# Patient Record
Sex: Male | Born: 1970
Health system: Southern US, Community
[De-identification: ages and names within clinical notes are randomized; demographics above are authoritative.]

## PROBLEM LIST (undated history)

## (undated) DIAGNOSIS — I10 Essential (primary) hypertension: Secondary | ICD-10-CM

## (undated) DIAGNOSIS — N189 Chronic kidney disease, unspecified: Secondary | ICD-10-CM

## (undated) DIAGNOSIS — T7840XA Allergy, unspecified, initial encounter: Secondary | ICD-10-CM

## (undated) HISTORY — DX: Chronic kidney disease, unspecified: N18.9

## (undated) HISTORY — DX: Allergy, unspecified, initial encounter: T78.40XA

## (undated) HISTORY — DX: Essential (primary) hypertension: I10

---

## 2009-09-04 ENCOUNTER — Ambulatory Visit: Payer: Self-pay | Admitting: Family Medicine

## 2009-09-07 LAB — CONVERTED CEMR LAB
ALT: 79 units/L — ABNORMAL HIGH (ref 0–53)
AST: 38 units/L — ABNORMAL HIGH (ref 0–37)
BUN: 21 mg/dL (ref 6–23)
Basophils Absolute: 0 10*3/uL (ref 0.0–0.1)
Bilirubin, Direct: 0.3 mg/dL (ref 0.0–0.3)
Calcium: 9.3 mg/dL (ref 8.4–10.5)
Creatinine, Ser: 1.2 mg/dL (ref 0.4–1.5)
Eosinophils Relative: 3.2 % (ref 0.0–5.0)
GFR calc non Af Amer: 70.86 mL/min (ref >60)
Glucose, Bld: 77 mg/dL (ref 70–99)
HDL: 28.8 mg/dL — ABNORMAL LOW (ref >39.00)
Lymphocytes Relative: 44.8 % (ref 12.0–46.0)
Lymphs Abs: 3.5 10*3/uL (ref 0.7–4.0)
Monocytes Relative: 8.4 % (ref 3.0–12.0)
Neutrophils Relative %: 43.1 % (ref 43.0–77.0)
Platelets: 264 10*3/uL (ref 150.0–400.0)
Potassium: 4.4 meq/L (ref 3.5–5.1)
RDW: 13.3 % (ref 11.5–14.6)
TSH: 1.48 microintl units/mL (ref 0.35–5.50)
Total Bilirubin: 1.5 mg/dL — ABNORMAL HIGH (ref 0.3–1.2)
Triglycerides: 302 mg/dL — ABNORMAL HIGH (ref 0.0–149.0)
VLDL: 60.4 mg/dL — ABNORMAL HIGH (ref 0.0–40.0)
WBC: 7.7 10*3/uL (ref 4.5–10.5)

## 2010-02-09 NOTE — Assessment & Plan Note (Signed)
Summary: BRAND NEW PT/TO EST/PT REQ CPX/PT FASTING/CJR   Vital Signs:  Patient profile:   40 year old male Height:      70.75 inches Weight:      191 pounds BMI:     26.92 Temp:     97.8 degrees F oral Pulse rate:   72 / minute Pulse rhythm:   regular Resp:     12 per minute BP sitting:   120 / 82  (left arm) Cuff size:   regular  Vitals Entered By: Sid Falcon LPN (September 04, 2009 10:36 AM)  Nutrition Counseling: Patient's BMI is greater than 25 and therefore counseled on weight management options. CC: New to [place]   History of Present Illness: New patient to establish care and for wellness visit.  Patient has history of kidney stones 3 times in the past. No other chronic problems. Takes no medications. No prior hospitalizations or surgeries. No known drug allergies.  Family history significant for father with hypertension.  Social history is that he is married and has 2 children ages 61 and 29. Nonsmoker. Rare alcohol use. Works in Scientist, research (physical sciences) & Management  Alcohol-Tobacco     Smoking Status: never  Caffeine-Diet-Exercise     Does Patient Exercise: yes  Allergies (verified): No Known Drug Allergies  Past History:  Family History: Last updated: 09/04/2009 Family History Hypertension, father  Social History: Last updated: 09/04/2009 Occupation:  Finance Married Never Smoked Alcohol use-yes Regular exercise-yes  Risk Factors: Exercise: yes (09/04/2009)  Risk Factors: Smoking Status: never (09/04/2009)  Past Medical History: Chicken pox Kidney stones 2001 PMH-FH-SH reviewed for relevance  Family History: Family History Hypertension, father  Social History: Occupation:  Finance Married Never Smoked Alcohol use-yes Regular exercise-yes Smoking Status:  never Occupation:  employed Does Patient Exercise:  yes  Review of Systems  The patient denies anorexia, fever, weight loss, weight gain, vision loss,  decreased hearing, hoarseness, chest pain, syncope, dyspnea on exertion, peripheral edema, prolonged cough, headaches, hemoptysis, abdominal pain, melena, hematochezia, severe indigestion/heartburn, hematuria, incontinence, genital sores, muscle weakness, suspicious skin lesions, transient blindness, difficulty walking, depression, unusual weight change, abnormal bleeding, enlarged lymph nodes, and testicular masses.    Physical Exam  General:  Well-developed,well-nourished,in no acute distress; alert,appropriate and cooperative throughout examination Head:  Normocephalic and atraumatic without obvious abnormalities. No apparent alopecia or balding. Eyes:  pupils equal, pupils round, and pupils reactive to light.   Ears:  External ear exam shows no significant lesions or deformities.  Otoscopic examination reveals clear canals, tympanic membranes are intact bilaterally without bulging, retraction, inflammation or discharge. Hearing is grossly normal bilaterally. Mouth:  Oral mucosa and oropharynx without lesions or exudates.  Teeth in good repair. Neck:  No deformities, masses, or tenderness noted. Lungs:  Normal respiratory effort, chest expands symmetrically. Lungs are clear to auscultation, no crackles or wheezes. Heart:  Normal rate and regular rhythm. S1 and S2 normal without gallop, murmur, click, rub or other extra sounds. Abdomen:  Bowel sounds positive,abdomen soft and non-tender without masses, organomegaly or hernias noted. Genitalia:  Testes bilaterally descended without nodularity, tenderness or masses. No scrotal masses or lesions. No penis lesions or urethral discharge. Msk:  No deformity or scoliosis noted of thoracic or lumbar spine.   Extremities:  No clubbing, cyanosis, edema, or deformity noted with normal full range of motion of all joints.   Neurologic:  No cranial nerve deficits noted. Station and gait are normal. Plantar reflexes are down-going bilaterally. DTRs are  symmetrical  throughout. Sensory, motor and coordinative functions appear intact. Skin:  Intact without suspicious lesions or rashes Cervical Nodes:  No lymphadenopathy noted Psych:  Oriented X3, good eye contact, not anxious appearing, and not depressed appearing.     Impression & Recommendations:  Problem # 1:  Preventive Health Care (ICD-V70.0) obtain screening labs. Patient needs tetanus booster  Other Orders: Venipuncture (16109) Specimen Handling (60454) Tdap => 63yrs IM (09811) Admin 1st Vaccine (91478) TLB-Lipid Panel (80061-LIPID) TLB-BMP (Basic Metabolic Panel-BMET) (80048-METABOL) TLB-CBC Platelet - w/Differential (85025-CBCD) TLB-Hepatic/Liver Function Pnl (80076-HEPATIC) TLB-TSH (Thyroid Stimulating Hormone) (29562-ZHY)  Patient Instructions: 1)  Please schedule a follow-up appointment in 1 year.  2)  It is important that you exercise reguarly at least 20 minutes 5 times a week. If you develop chest pain, have severe difficulty breathing, or feel very tired, stop exercising immediately and seek medical attention.    Immunizations Administered:  Tetanus Vaccine:    Vaccine Type: Tdap    Site: left deltoid    Mfr: GlaxoSmithKline    Dose: 0.5 ml    Route: IM    Given by: Sid Falcon LPN    Exp. Date: 10/30/2011    Lot #: [id number]    VIS given: 11/28/06 version given September 04, 2009.

## 2010-02-22 ENCOUNTER — Ambulatory Visit: Payer: Self-pay | Admitting: Family Medicine

## 2010-02-26 ENCOUNTER — Ambulatory Visit: Payer: Self-pay | Admitting: Family Medicine

## 2010-03-18 ENCOUNTER — Encounter: Payer: Self-pay | Admitting: Family Medicine

## 2010-03-18 ENCOUNTER — Ambulatory Visit (INDEPENDENT_AMBULATORY_CARE_PROVIDER_SITE_OTHER): Payer: BC Managed Care – PPO | Admitting: Family Medicine

## 2010-03-18 VITALS — BP 120/90 | Temp 98.7°F | Ht 71.0 in | Wt 195.0 lb

## 2010-03-18 DIAGNOSIS — R7402 Elevation of levels of lactic acid dehydrogenase (LDH): Secondary | ICD-10-CM

## 2010-03-18 DIAGNOSIS — R740 Nonspecific elevation of levels of transaminase and lactic acid dehydrogenase [LDH]: Secondary | ICD-10-CM

## 2010-03-18 LAB — HEPATIC FUNCTION PANEL
AST: 39 U/L — ABNORMAL HIGH (ref 0–37)
Albumin: 4.5 g/dL (ref 3.5–5.2)
Alkaline Phosphatase: 96 U/L (ref 39–117)
Bilirubin, Direct: 0.2 mg/dL (ref 0.0–0.3)
Total Protein: 7.3 g/dL (ref 6.0–8.3)

## 2010-03-18 NOTE — Progress Notes (Signed)
  Subjective:    Patient ID: Aaron Mahoney, male    DOB: [birth year], 40 y.o.   MRN: [id number]  HPI  Patient seen for followup from recent physical. Mildly elevated liver transaminases. No prior history of similar problem. Rare alcohol use. No prior history of blood transfusion. Denies any recent abdominal pain, fever, chills, jaundice, pruritus , appetite, or weight changes. No regular medications. One brother apparently has had prior fatty liver changes.   Review of Systems  Constitutional: Negative for fever, chills, activity change, appetite change, fatigue and unexpected weight change.  Gastrointestinal: Negative for nausea, vomiting, abdominal pain, constipation and blood in stool.       Objective:   Physical Exam  Constitutional: He appears well-developed and well-nourished.  Eyes: Conjunctivae are normal. Pupils are equal, round, and reactive to light.  Cardiovascular: Normal rate, regular rhythm and normal heart sounds.   Pulmonary/Chest: Effort normal and breath sounds normal. He has no wheezes. He has no rales.  Abdominal: Soft. Bowel sounds are normal. He exhibits no distension and no mass. There is no tenderness.          Assessment & Plan:   mildly elevated liver transaminases. Suspect related to fatty liver changes.  Work on weight loss. Repeat hepatic panel today

## 2010-03-19 NOTE — Progress Notes (Signed)
Quick Note:  Pt informed ______ 

## 2010-12-13 ENCOUNTER — Encounter: Payer: Self-pay | Admitting: Family Medicine

## 2010-12-13 ENCOUNTER — Ambulatory Visit: Payer: BC Managed Care – PPO | Admitting: Family Medicine

## 2010-12-15 ENCOUNTER — Ambulatory Visit: Payer: BC Managed Care – PPO | Admitting: Family Medicine

## 2011-09-24 ENCOUNTER — Ambulatory Visit: Payer: BC Managed Care – PPO | Admitting: Family Medicine

## 2013-05-22 ENCOUNTER — Emergency Department (HOSPITAL_BASED_OUTPATIENT_CLINIC_OR_DEPARTMENT_OTHER): Payer: BC Managed Care – PPO

## 2013-05-22 ENCOUNTER — Encounter (HOSPITAL_BASED_OUTPATIENT_CLINIC_OR_DEPARTMENT_OTHER): Payer: Self-pay | Admitting: Emergency Medicine

## 2013-05-22 ENCOUNTER — Emergency Department (HOSPITAL_BASED_OUTPATIENT_CLINIC_OR_DEPARTMENT_OTHER)
Admission: EM | Admit: 2013-05-22 | Discharge: 2013-05-22 | Disposition: A | Discharge status: Home / Self Care | Payer: BC Managed Care – PPO | Attending: Emergency Medicine | Admitting: Emergency Medicine

## 2013-05-22 DIAGNOSIS — Z87440 Personal history of urinary (tract) infections: Secondary | ICD-10-CM | POA: Insufficient documentation

## 2013-05-22 DIAGNOSIS — R110 Nausea: Secondary | ICD-10-CM | POA: Insufficient documentation

## 2013-05-22 DIAGNOSIS — R319 Hematuria, unspecified: Secondary | ICD-10-CM | POA: Insufficient documentation

## 2013-05-22 DIAGNOSIS — N23 Unspecified renal colic: Secondary | ICD-10-CM | POA: Insufficient documentation

## 2013-05-22 DIAGNOSIS — M549 Dorsalgia, unspecified: Secondary | ICD-10-CM | POA: Insufficient documentation

## 2013-05-22 LAB — URINALYSIS, ROUTINE W REFLEX MICROSCOPIC
Bilirubin Urine: NEGATIVE
GLUCOSE, UA: NEGATIVE mg/dL
KETONES UR: 40 mg/dL — AB
Leukocytes, UA: NEGATIVE
Nitrite: NEGATIVE
PROTEIN: NEGATIVE mg/dL
Specific Gravity, Urine: 1.023 (ref 1.005–1.030)
UROBILINOGEN UA: 0.2 mg/dL (ref 0.0–1.0)
pH: 5 (ref 5.0–8.0)

## 2013-05-22 LAB — CBC WITH DIFFERENTIAL/PLATELET
Basophils Absolute: 0 10*3/uL (ref 0.0–0.1)
Basophils Relative: 0 % (ref 0–1)
EOS PCT: 1 % (ref 0–5)
Eosinophils Absolute: 0.1 10*3/uL (ref 0.0–0.7)
HCT: 41.2 % (ref 39.0–52.0)
Hemoglobin: 14.5 g/dL (ref 13.0–17.0)
LYMPHS ABS: 1.7 10*3/uL (ref 0.7–4.0)
LYMPHS PCT: 14 % (ref 12–46)
MCH: 30.7 pg (ref 26.0–34.0)
MCHC: 35.2 g/dL (ref 30.0–36.0)
MCV: 87.3 fL (ref 78.0–100.0)
Monocytes Absolute: 1.3 10*3/uL — ABNORMAL HIGH (ref 0.1–1.0)
Monocytes Relative: 10 % (ref 3–12)
NEUTROS ABS: 9.5 10*3/uL — AB (ref 1.7–7.7)
Neutrophils Relative %: 75 % (ref 43–77)
PLATELETS: 240 10*3/uL (ref 150–400)
RBC: 4.72 MIL/uL (ref 4.22–5.81)
RDW: 12.9 % (ref 11.5–15.5)
WBC: 12.7 10*3/uL — AB (ref 4.0–10.5)

## 2013-05-22 LAB — URINE MICROSCOPIC-ADD ON

## 2013-05-22 LAB — COMPREHENSIVE METABOLIC PANEL
ALK PHOS: 65 U/L (ref 39–117)
ALT: 23 U/L (ref 0–53)
AST: 24 U/L (ref 0–37)
Albumin: 4.2 g/dL (ref 3.5–5.2)
BILIRUBIN TOTAL: 1.6 mg/dL — AB (ref 0.3–1.2)
BUN: 17 mg/dL (ref 6–23)
CHLORIDE: 103 meq/L (ref 96–112)
CO2: 24 meq/L (ref 19–32)
Calcium: 10 mg/dL (ref 8.4–10.5)
Creatinine, Ser: 1.8 mg/dL — ABNORMAL HIGH (ref 0.50–1.35)
GFR calc Af Amer: 51 mL/min — ABNORMAL LOW (ref >90)
GFR, EST NON AFRICAN AMERICAN: 44 mL/min — AB (ref >90)
Glucose, Bld: 106 mg/dL — ABNORMAL HIGH (ref 70–99)
POTASSIUM: 3.7 meq/L (ref 3.7–5.3)
Sodium: 142 mEq/L (ref 137–147)
Total Protein: 7 g/dL (ref 6.0–8.3)

## 2013-05-22 MED ORDER — OXYCODONE-ACETAMINOPHEN 5-325 MG PO TABS: 2.0000 | ORAL_TABLET | ORAL | Status: DC | PRN

## 2013-05-22 MED ORDER — KETOROLAC TROMETHAMINE 30 MG/ML IJ SOLN
30.0000 mg | Freq: Once | INTRAMUSCULAR | Status: AC
  Administered 2013-05-22: 30 mg via INTRAVENOUS
  Filled 2013-05-22: qty 1

## 2013-05-22 MED ORDER — HYDROMORPHONE HCL PF 1 MG/ML IJ SOLN
1.0000 mg | Freq: Once | INTRAMUSCULAR | Status: AC
  Administered 2013-05-22: 1 mg via INTRAVENOUS

## 2013-05-22 MED ORDER — ONDANSETRON HCL 4 MG/2ML IJ SOLN
4.0000 mg | Freq: Once | INTRAMUSCULAR | Status: AC
  Administered 2013-05-22: 4 mg via INTRAVENOUS
  Filled 2013-05-22: qty 2

## 2013-05-22 MED ORDER — ONDANSETRON 4 MG PO TBDP: ORAL_TABLET | ORAL | Status: DC

## 2013-05-22 MED ORDER — MORPHINE SULFATE 4 MG/ML IJ SOLN
4.0000 mg | Freq: Once | INTRAMUSCULAR | Status: AC
  Administered 2013-05-22: 4 mg via INTRAVENOUS
  Filled 2013-05-22: qty 1

## 2013-05-22 MED ORDER — SODIUM CHLORIDE 0.9 % IV BOLUS (SEPSIS)
1000.0000 mL | Freq: Once | INTRAVENOUS | Status: AC
  Administered 2013-05-22: 1000 mL via INTRAVENOUS

## 2013-05-22 MED ORDER — TAMSULOSIN HCL 0.4 MG PO CAPS: 0.4000 mg | ORAL_CAPSULE | Freq: Every day | ORAL | Status: DC

## 2013-05-22 MED ORDER — KETOROLAC TROMETHAMINE 10 MG PO TABS: 10.0000 mg | ORAL_TABLET | Freq: Four times a day (QID) | ORAL | Status: DC | PRN

## 2013-05-22 MED ORDER — HYDROMORPHONE HCL PF 1 MG/ML IJ SOLN
INTRAMUSCULAR | Status: AC
  Filled 2013-05-22: qty 1

## 2013-05-22 NOTE — Discharge Instructions (Signed)
Kidney Stones  Kidney stones (urolithiasis) are deposits that form inside your kidneys. The intense pain is caused by the stone moving through the urinary tract. When the stone moves, the ureter goes into spasm around the stone. The stone is usually passed in the urine.   CAUSES   · A disorder that makes certain neck glands produce too much parathyroid hormone (primary hyperparathyroidism).  · A buildup of uric acid crystals, similar to gout in your joints.  · Narrowing (stricture) of the ureter.  · A kidney obstruction present at birth (congenital obstruction).  · Previous surgery on the kidney or ureters.  · Numerous kidney infections.  SYMPTOMS   · Feeling sick to your stomach (nauseous).  · Throwing up (vomiting).  · Blood in the urine (hematuria).  · Pain that usually spreads (radiates) to the groin.  · Frequency or urgency of urination.  DIAGNOSIS   · Taking a history and physical exam.  · Blood or urine tests.  · CT scan.  · Occasionally, an examination of the inside of the urinary bladder (cystoscopy) is performed.  TREATMENT   · Observation.  · Increasing your fluid intake.  · Extracorporeal shock wave lithotripsy This is a noninvasive procedure that uses shock waves to break up kidney stones.  · Surgery may be needed if you have severe pain or persistent obstruction. There are various surgical procedures. Most of the procedures are performed with the use of small instruments. Only small incisions are needed to accommodate these instruments, so recovery time is minimized.  The size, location, and chemical composition are all important variables that will determine the proper choice of action for you. Talk to your health care provider to better understand your situation so that you will minimize the risk of injury to yourself and your kidney.   HOME CARE INSTRUCTIONS   · Drink enough water and fluids to keep your urine clear or pale yellow. This will help you to pass the stone or stone fragments.  · Strain  all urine through the provided strainer. Keep all particulate matter and stones for your health care provider to see. The stone causing the pain may be as small as a grain of salt. It is very important to use the strainer each and every time you pass your urine. The collection of your stone will allow your health care provider to analyze it and verify that a stone has actually passed. The stone analysis will often identify what you can do to reduce the incidence of recurrences.  · Only take over-the-counter or prescription medicines for pain, discomfort, or fever as directed by your health care provider.  · Make a follow-up appointment with your health care provider as directed.  · Get follow-up X-rays if required. The absence of pain does not always mean that the stone has passed. It may have only stopped moving. If the urine remains completely obstructed, it can cause loss of kidney function or even complete destruction of the kidney. It is your responsibility to make sure X-rays and follow-ups are completed. Ultrasounds of the kidney can show blockages and the status of the kidney. Ultrasounds are not associated with any radiation and can be performed easily in a matter of minutes.  SEEK MEDICAL CARE IF:  · You experience pain that is progressive and unresponsive to any pain medicine you have been prescribed.  SEEK IMMEDIATE MEDICAL CARE IF:   · Pain cannot be controlled with the prescribed medicine.  · You have a fever   or shaking chills.  · The severity or intensity of pain increases over 18 hours and is not relieved by pain medicine.  · You develop a new onset of abdominal pain.  · You feel faint or pass out.  · You are unable to urinate.  MAKE SURE YOU:   · Understand these instructions.  · Will watch your condition.  · Will get help right away if you are not doing well or get worse.  Document Released: 12/27/2004 Document Revised: 08/29/2012 Document Reviewed: 05/30/2012  [employer]® Patient Information ©2014  [employer].

## 2013-05-22 NOTE — ED Notes (Signed)
Left flank pain intermittently since Monday, no relief from OTC ibuprofen, nausea and vomiting

## 2013-05-22 NOTE — ED Provider Notes (Signed)
CSN: [id number]     Arrival date & time 05/22/13  0245 History   First MD Initiated Contact with Patient 05/22/13 0248     Chief Complaint  Patient presents with  . Flank Pain     (Consider location/radiation/quality/duration/timing/severity/associated sxs/prior Treatment) HPI Patient was diagnosed with renal stones 15 years ago and has had intermittent passage of stones since. He states that starting Mondays had intermittent left flank pain that radiates to his groin. It is associated with nausea multiple episodes of vomiting. He's been taking ibuprofen over-the-counter without relief. He's had no fevers or chills. He has noticed blood in his urine. His last CT scan was roughly 15 years ago. He has yet to see a urologist. Past Medical History  Diagnosis Date  . Chronic kidney disease     stone history   History reviewed. No pertinent past surgical history. Family History  Problem Relation Age of Onset  . Hypertension Father   . Heart disease Father   . Hypertension Brother    History  Substance Use Topics  . Smoking status: Never Smoker   . Smokeless tobacco: Not on file  . Alcohol Use: No    Review of Systems  Constitutional: Negative for fever and chills.  Respiratory: Negative for shortness of breath.   Cardiovascular: Negative for chest pain.  Gastrointestinal: Positive for nausea. Negative for abdominal pain.  Genitourinary: Positive for hematuria and flank pain.  Musculoskeletal: Positive for back pain. Negative for neck pain and neck stiffness.  Skin: Negative for rash and wound.  Neurological: Negative for dizziness, weakness, light-headedness and headaches.  All other systems reviewed and are negative.     Allergies  Review of patient's allergies indicates no known allergies.  Home Medications   Prior to Admission medications   Not on File   BP 148/93  Pulse 78  Temp(Src) 98 F (36.7 C) (Oral)  Resp 16  Ht 6\' 2"  (1.88 m)  Wt 160 lb (72.576 kg)   BMI 20.53 kg/m2  SpO2 97% Physical Exam  Nursing note and vitals reviewed. Constitutional: He is oriented to person, place, and time. He appears well-developed and well-nourished. No distress.  HENT:  Head: Normocephalic and atraumatic.  Mouth/Throat: Oropharynx is clear and moist.  Eyes: EOM are normal. Pupils are equal, round, and reactive to light.  Neck: Normal range of motion. Neck supple.  Cardiovascular: Normal rate and regular rhythm.   Pulmonary/Chest: Effort normal and breath sounds normal. No respiratory distress. He has no wheezes. He has no rales.  Abdominal: Soft. Bowel sounds are normal. He exhibits no distension and no mass. There is no tenderness. There is no rebound and no guarding.  Musculoskeletal: Normal range of motion. He exhibits no edema and no tenderness.  No CVA tenderness  Neurological: He is alert and oriented to person, place, and time.  Skin: Skin is warm and dry. No rash noted. No erythema.  Psychiatric: He has a normal mood and affect. His behavior is normal.    ED Course  Procedures (including critical care time) Labs Review Labs Reviewed  CBC WITH DIFFERENTIAL - Abnormal; Notable for the following:    WBC 12.7 (*)    Neutro Abs 9.5 (*)    Monocytes Absolute 1.3 (*)    All other components within normal limits  COMPREHENSIVE METABOLIC PANEL - Abnormal; Notable for the following:    Glucose, Bld 106 (*)    Creatinine, Ser 1.80 (*)    Total Bilirubin 1.6 (*)    GFR  calc non Af Amer 44 (*)    GFR calc Af Amer 51 (*)    All other components within normal limits  URINALYSIS, ROUTINE W REFLEX MICROSCOPIC - Abnormal; Notable for the following:    Hgb urine dipstick MODERATE (*)    Ketones, ur 40 (*)    All other components within normal limits  URINE MICROSCOPIC-ADD ON - Abnormal; Notable for the following:    Bacteria, UA FEW (*)    Casts WBC CAST (*)    All other components within normal limits    Imaging Review Ct Abdomen Pelvis Wo  Contrast  05/22/2013   CLINICAL DATA:  Left flank pain.  EXAM: CT ABDOMEN AND PELVIS WITHOUT CONTRAST  TECHNIQUE: Multidetector CT imaging of the abdomen and pelvis was performed following the standard protocol without IV contrast.  COMPARISON:  None.  FINDINGS: Included view of the lung bases are clear. The visualized heart and pericardium are unremarkable.  Kidneys are orthotopic, demonstrating normal size and morphology. Mild left hydroureteronephrosis to the level of the ureterovesicular junction where a 4 mm calculus is seen. Mild left perinephric stranding. 3 mm nonobstructing right interpolar calculus. Nephrolithiasis, hydronephrosis; limited assessment for renal masses on this nonenhanced examination. The unopacified ureters are normal in course and caliber. Urinary bladder is partially distended and unremarkable.  The liver, spleen, gallbladder, pancreas and adrenal glands are unremarkable for this non-contrast examination.  The stomach, small and large bowel are normal in course and caliber without inflammatory changes, the sensitivity may be decreased by lack of enteric contrast. Mild sigmoid diverticulosis. Normal appendix. No intraperitoneal free fluid nor free air.  Aortoiliac vessels are normal in course and caliber. No lymphadenopathy by CT size criteria. Internal reproductive organs are unremarkable. The soft tissues and included osseous structures are nonsuspicious. Dextroscoliosis. Mild degenerative change of the hips. Moderate to severe L4-5 degenerative disc disease resulting in moderate right L4-5 neural foraminal narrowing.  IMPRESSION: Mild left hydroureteronephrosis to the level of the ureterovesicular junction where a 4 mm calculus is seen. Mild nonspecific left perinephric stranding could reflect urinary tract infection.  Nonobstructing 3 mm right interpolar calculus.   Electronically Signed   By: Awilda Metroourtnay  Bloomer   On: 05/22/2013 03:40     EKG Interpretation None      MDM    Final diagnoses:  None    Patient's pain has resolved. He is not vomiting in the emergency department. He is afebrile. Low suspicion for infected stone. Advised followup with a urologist. Maryclare LabradorWe'll discharge home with pain medication and antiemetics. Return precautions given.    Loren Raceravid Ricky Gallery, MD 05/22/13 [phone]

## 2013-05-22 NOTE — ED Notes (Signed)
MD at bedside. 

## 2015-08-20 ENCOUNTER — Emergency Department (HOSPITAL_BASED_OUTPATIENT_CLINIC_OR_DEPARTMENT_OTHER): Payer: Managed Care, Other (non HMO)

## 2015-08-20 ENCOUNTER — Emergency Department (HOSPITAL_BASED_OUTPATIENT_CLINIC_OR_DEPARTMENT_OTHER)
Admission: EM | Admit: 2015-08-20 | Discharge: 2015-08-20 | Disposition: A | Discharge status: Home / Self Care | Payer: Managed Care, Other (non HMO) | Attending: Emergency Medicine | Admitting: Emergency Medicine

## 2015-08-20 ENCOUNTER — Encounter (HOSPITAL_BASED_OUTPATIENT_CLINIC_OR_DEPARTMENT_OTHER): Payer: Self-pay | Admitting: *Deleted

## 2015-08-20 DIAGNOSIS — Z791 Long term (current) use of non-steroidal anti-inflammatories (NSAID): Secondary | ICD-10-CM | POA: Diagnosis not present

## 2015-08-20 DIAGNOSIS — N189 Chronic kidney disease, unspecified: Secondary | ICD-10-CM | POA: Diagnosis not present

## 2015-08-20 DIAGNOSIS — N23 Unspecified renal colic: Secondary | ICD-10-CM | POA: Diagnosis not present

## 2015-08-20 DIAGNOSIS — R319 Hematuria, unspecified: Secondary | ICD-10-CM | POA: Diagnosis present

## 2015-08-20 LAB — URINALYSIS, ROUTINE W REFLEX MICROSCOPIC
Bilirubin Urine: NEGATIVE
Glucose, UA: NEGATIVE mg/dL
Ketones, ur: 15 mg/dL — AB
NITRITE: NEGATIVE
PROTEIN: 30 mg/dL — AB
Specific Gravity, Urine: 1.015 (ref 1.005–1.030)
pH: 7 (ref 5.0–8.0)

## 2015-08-20 LAB — URINE MICROSCOPIC-ADD ON

## 2015-08-20 MED ORDER — OXYCODONE-ACETAMINOPHEN 5-325 MG PO TABS: 1.0000 | ORAL_TABLET | ORAL | 0 refills | Status: DC | PRN

## 2015-08-20 MED ORDER — TAMSULOSIN HCL 0.4 MG PO CAPS: 0.4000 mg | ORAL_CAPSULE | Freq: Every day | ORAL | 0 refills | Status: DC

## 2015-08-20 MED ORDER — HYDROMORPHONE HCL 1 MG/ML IJ SOLN
1.0000 mg | Freq: Once | INTRAMUSCULAR | Status: AC
  Administered 2015-08-20: 1 mg via INTRAVENOUS

## 2015-08-20 MED ORDER — KETOROLAC TROMETHAMINE 30 MG/ML IJ SOLN
30.0000 mg | Freq: Once | INTRAMUSCULAR | Status: AC
  Administered 2015-08-20: 30 mg via INTRAVENOUS
  Filled 2015-08-20: qty 1

## 2015-08-20 MED ORDER — HYDROMORPHONE HCL 1 MG/ML IJ SOLN
INTRAMUSCULAR | Status: AC
  Administered 2015-08-20: 1 mg via INTRAVENOUS
  Filled 2015-08-20: qty 1

## 2015-08-20 MED ORDER — ONDANSETRON HCL 4 MG/2ML IJ SOLN
INTRAMUSCULAR | Status: AC
  Administered 2015-08-20: 4 mg via INTRAVENOUS
  Filled 2015-08-20: qty 2

## 2015-08-20 MED ORDER — ONDANSETRON HCL 4 MG PO TABS: 4.0000 mg | ORAL_TABLET | Freq: Three times a day (TID) | ORAL | 0 refills | Status: DC | PRN

## 2015-08-20 MED ORDER — ONDANSETRON HCL 4 MG/2ML IJ SOLN
4.0000 mg | Freq: Once | INTRAMUSCULAR | Status: AC
  Administered 2015-08-20: 4 mg via INTRAVENOUS

## 2015-08-20 MED FILL — TAMSULOSIN HCL 0.4 MG CAP: 0.4 | 30 days supply | Qty: 30 | Fill #0

## 2015-08-20 MED FILL — ONDANSETRON HCL 4 MG TABLET: 4 | 4 days supply | Qty: 12 | Fill #0

## 2015-08-20 MED FILL — OXYCODONE/APAP 5-325: 5-325 | 2 days supply | Qty: 20 | Fill #0

## 2015-08-20 NOTE — ED Triage Notes (Signed)
Patient has a history of kidney stones.  States three days ago, he developed intermittent left lower back pain with radiation into the flank area.  States this morning, he developed a constant pain in the left groin area and is urinating blood.  Describes pain as sharp, stabbing and constant.

## 2015-08-20 NOTE — ED Provider Notes (Signed)
[facility] Provider Note   CSN: [id number] Arrival date & time: 08/20/15  0909  First Provider Contact:  First MD Initiated Contact with Patient 08/20/15 [phone]        History   Chief Complaint Chief Complaint  Patient presents with  . Flank Pain    HPI Aaron Mahoney is a 45 y.o. male.  HPI   Patient with hx kidney stones presents with three days left flank pain now causing sharp pain in the left groin area.  Pain is sharp and was initially intermittent, now constant.  Hematuria this morning.  Feels like prior kidney stones.  Denies fevers, chills, testicular swelling, nausea/vomiting.  Has a urologist at [facility] in [place], unsure of name.  Has never had to have a procedure to pass a kidney stone, usually occur every 2 years.    Past Medical History:  Diagnosis Date  . Chronic kidney disease    stone history    There are no active problems to display for this patient.   History reviewed. No pertinent surgical history.     Home Medications    Prior to Admission medications   Medication Sig Start Date End Date Taking? Authorizing Provider  ibuprofen (ADVIL,MOTRIN) 800 MG tablet Take 800 mg by mouth every 8 (eight) hours as needed.   Yes Historical Provider, MD  ketorolac (TORADOL) 10 MG tablet Take 1 tablet (10 mg total) by mouth every 6 (six) hours as needed. 05/22/13   Loren Racer, MD  ondansetron (ZOFRAN ODT) 4 MG disintegrating tablet  ODT q4 hours prn nausea/vomit 05/22/13   Loren Racer, MD  ondansetron (ZOFRAN) 4 MG tablet Take 1 tablet (4 mg total) by mouth every 8 (eight) hours as needed for nausea or vomiting. 08/20/15   Trixie Dredge, PA-C  oxyCODONE-acetaminophen (PERCOCET/ROXICET) 5-325 MG tablet Take 1-2 tablets by mouth every 4 (four) hours as needed for moderate pain or severe pain. 08/20/15   Trixie Dredge, PA-C  tamsulosin (FLOMAX) 0.4 MG CAPS capsule Take 1 capsule (0.4 mg total) by mouth daily. 08/20/15   Trixie Dredge, PA-C     Family History Family History  Problem Relation Age of Onset  . Hypertension Father   . Heart disease Father   . Hypertension Brother     Social History Social History  Substance Use Topics  . Smoking status: Never Smoker  . Smokeless tobacco: Not on file  . Alcohol use No     Allergies   Review of patient's allergies indicates no known allergies.   Review of Systems Review of Systems  Constitutional: Negative for chills and fever.  Gastrointestinal: Negative for nausea and vomiting.  Genitourinary: Positive for flank pain and hematuria. Negative for penile swelling and scrotal swelling.  Musculoskeletal: Positive for back pain.  Allergic/Immunologic: Negative for immunocompromised state.  Hematological: Does not bruise/bleed easily.  Psychiatric/Behavioral: Negative for self-injury.     Physical Exam Updated Vital Signs BP 138/100 (BP Location: Left Arm)   Pulse (!) 51   Temp 97.7 F (36.5 C) (Oral)   Resp 16   Ht 6' (1.829 m)   Wt 81.6 kg   SpO2 99%   BMI 24.41 kg/m   Physical Exam  Constitutional: He appears well-developed and well-nourished. No distress.  HENT:  Head: Normocephalic and atraumatic.  Neck: Neck supple.  Cardiovascular: Normal rate and regular rhythm.   Pulmonary/Chest: Effort normal and breath sounds normal. No respiratory distress. He has no wheezes. He has no rales.  Abdominal: Soft. He exhibits  no distension and no mass. There is no tenderness. There is no rebound and no guarding.  No CVA tenderness   Neurological: He is alert. He exhibits normal muscle tone.  Skin: He is not diaphoretic.  Nursing note and vitals reviewed.    ED Treatments / Results  Labs (all labs ordered are listed, but only abnormal results are displayed) Labs Reviewed  URINALYSIS, ROUTINE W REFLEX MICROSCOPIC (NOT AT [facility]) - Abnormal; Notable for the following:       Result Value   Color, Urine RED (*)    APPearance CLOUDY (*)    Hgb urine dipstick  LARGE (*)    Ketones, ur 15 (*)    Protein, ur 30 (*)    Leukocytes, UA TRACE (*)    All other components within normal limits  URINE MICROSCOPIC-ADD ON - Abnormal; Notable for the following:    Squamous Epithelial / LPF 0-5 (*)    Bacteria, UA RARE (*)    All other components within normal limits  URINE CULTURE    EKG  EKG Interpretation None       Radiology US Renal  Result Date: 08/20/2015 CLINICAL DATA:  45 year old male with severe left flank pain radiating into the left groin for the past 3 days EXAM: RENAL / URINARY TRACT ULTRASOUND COMPLETE COMPARISON:  Prior CT abdomen/ pelvis 05/22/2013 FINDINGS: Right Kidney: Length: 9.7 cm. Echogenicity within normal limits. No mass or hydronephrosis visualized. Left Kidney: Length: 10.2 cm. Echogenicity within normal limits. No mass or hydronephrosis visualized. Bladder: Appears normal for degree of bladder distention. Both ureteral jets are visualized. Other: Incidental imaging of the liver demonstrates heterogeneous and increased echogenicity with coarsening of the echotexture. The adjacent kidney appears hypoechoic in comparison. These findings are consistent with hepatic steatosis. IMPRESSION: 1. No hydronephrosis. 2. No definite renal calculi imaged sonographically. 3. Hepatic steatosis. Electronically Signed   By: Malachy Moan M.D.   On: 08/20/2015 10:27    Procedures Procedures (including critical care time)  Medications Ordered in ED Medications  ondansetron (ZOFRAN) injection 4 mg (4 mg Intravenous Given 08/20/15 0945)  HYDROmorphone (DILAUDID) injection 1 mg (1 mg Intravenous Given 08/20/15 0945)  ketorolac (TORADOL) 30 MG/ML injection 30 mg (30 mg Intravenous Given 08/20/15 1016)     Initial Impression / Assessment and Plan / ED Course  I have reviewed the triage vital signs and the nursing notes.  Pertinent labs & imaging results that were available during my care of the patient were reviewed by me and considered in  my medical decision making (see chart for details).  Clinical Course    Afebrile, nontoxic patient with left flank pain and hematuria.  US unremarkable.  UA does not appear infected.  Clinically c/w ureteral stone.  D/C home with percocet, zofran, flomax, urology follow up.  Discussed result, findings, treatment, and follow up  with patient.  Pt given return precautions.  Pt verbalizes understanding and agrees with plan.       Final Clinical Impressions(s) / ED Diagnoses   Final diagnoses:  Ureteral colic    New Prescriptions New Prescriptions   ONDANSETRON (ZOFRAN) 4 MG TABLET    Take 1 tablet (4 mg total) by mouth every 8 (eight) hours as needed for nausea or vomiting.   OXYCODONE-ACETAMINOPHEN (PERCOCET/ROXICET) 5-325 MG TABLET    Take 1-2 tablets by mouth every 4 (four) hours as needed for moderate pain or severe pain.   TAMSULOSIN (FLOMAX) 0.4 MG CAPS CAPSULE    Take 1 capsule (0.4  mg total) by mouth daily.     Trixie Dredgemily Gemini Bunte, PA-C 08/20/15 1147    Vanetta MuldersScott Zackowski, MD 08/22/15 2145

## 2015-08-20 NOTE — Discharge Instructions (Signed)
Read the information below.  Use the prescribed medication as directed.  Please discuss all new medications with your pharmacist.  Do not take additional tylenol while taking the prescribed pain medication to avoid overdose.  You may return to the Emergency Department at any time for worsening condition or any new symptoms that concern you.    If you develop high fevers, worsening abdominal pain, uncontrolled vomiting, or are unable to tolerate fluids by mouth, return to the ER for a recheck.   °

## 2015-08-21 LAB — URINE CULTURE: Culture: NO GROWTH

## 2016-02-29 ENCOUNTER — Ambulatory Visit: Payer: Managed Care, Other (non HMO) | Admitting: Family Medicine

## 2016-03-01 ENCOUNTER — Ambulatory Visit: Payer: Managed Care, Other (non HMO) | Admitting: Adult Health

## 2016-05-24 ENCOUNTER — Encounter: Payer: Self-pay | Admitting: Family Medicine

## 2016-05-24 ENCOUNTER — Ambulatory Visit (INDEPENDENT_AMBULATORY_CARE_PROVIDER_SITE_OTHER): Payer: Managed Care, Other (non HMO) | Admitting: Family Medicine

## 2016-05-24 VITALS — BP 130/86 | HR 60 | Temp 98.4°F | Ht 70.0 in | Wt 190.0 lb

## 2016-05-24 DIAGNOSIS — N200 Calculus of kidney: Secondary | ICD-10-CM | POA: Diagnosis not present

## 2016-05-24 NOTE — Progress Notes (Signed)
   Subjective:    Patient ID: Aaron Mahoney, male    DOB: 10/06/1970, 46 y.o.   MRN: [id number]  HPI 46 yr old male to re-establish with us for primary care. He has been quite healthy except for a hx of kidney stones. He has passed 4 or 5 of these over the years, the last one was 2 years ago. He feels well and has no concerns today. 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 He is married with 4 children ages 2 to 3914.    Review of Systems  Constitutional: Negative.   Respiratory: Negative.   Cardiovascular: Negative.   Gastrointestinal: Negative.   Genitourinary: Negative.   Neurological: Negative.        Objective:   Physical Exam  Constitutional: He is oriented to person, place, and time. He appears well-developed and well-nourished.  Neck: No thyromegaly present.  Cardiovascular: Normal rate, regular rhythm, normal heart sounds and intact distal pulses.   Pulmonary/Chest: Effort normal and breath sounds normal. No respiratory distress. He has no wheezes. He has no rales.  Lymphadenopathy:    He has no cervical adenopathy.  Neurological: He is alert and oriented to person, place, and time.          Assessment & Plan:  Introductory visit for this patient who has a hx of renal stones. He drinks plenty of water every day. He plans to return soon for fasting labs and a complete physical exam.  Gershon CraneStephen Fry, MD

## 2016-05-24 NOTE — Patient Instructions (Signed)
WE NOW OFFER   [facility] FAST TRACK!!!  SAME DAY Appointments for ACUTE CARE  Such as: Sprains, Injuries, cuts, abrasions, rashes, muscle pain, joint pain, back pain Colds, flu, sore throats, headache, allergies, cough, fever  Ear pain, sinus and eye infections Abdominal pain, nausea, vomiting, diarrhea, upset stomach Animal/insect bites  3 Easy Ways to Schedule: Walk-In Scheduling Call in scheduling Mychart Sign-up: [email or web address]         

## 2016-07-28 ENCOUNTER — Encounter: Payer: Self-pay | Admitting: Family Medicine

## 2016-07-28 ENCOUNTER — Ambulatory Visit (INDEPENDENT_AMBULATORY_CARE_PROVIDER_SITE_OTHER): Payer: Managed Care, Other (non HMO) | Admitting: Family Medicine

## 2016-07-28 VITALS — BP 143/93 | HR 50 | Temp 98.3°F | Ht 70.0 in | Wt 186.0 lb

## 2016-07-28 DIAGNOSIS — Z0000 Encounter for general adult medical examination without abnormal findings: Secondary | ICD-10-CM | POA: Diagnosis not present

## 2016-07-28 LAB — HEPATIC FUNCTION PANEL
ALBUMIN: 4.4 g/dL (ref 3.5–5.2)
ALK PHOS: 87 U/L (ref 39–117)
ALT: 34 U/L (ref 0–53)
AST: 23 U/L (ref 0–37)
Bilirubin, Direct: 0.2 mg/dL (ref 0.0–0.3)
Total Bilirubin: 1.3 mg/dL — ABNORMAL HIGH (ref 0.2–1.2)
Total Protein: 6.6 g/dL (ref 6.0–8.3)

## 2016-07-28 LAB — POC URINALSYSI DIPSTICK (AUTOMATED)
Bilirubin, UA: NEGATIVE
GLUCOSE UA: NEGATIVE
Ketones, UA: NEGATIVE
Leukocytes, UA: NEGATIVE
NITRITE UA: NEGATIVE
PH UA: 6 (ref 5.0–8.0)
Protein, UA: NEGATIVE
Spec Grav, UA: 1.015 (ref 1.010–1.025)
UROBILINOGEN UA: 0.2 U/dL

## 2016-07-28 LAB — BASIC METABOLIC PANEL
BUN: 15 mg/dL (ref 6–23)
CO2: 31 mEq/L (ref 19–32)
CREATININE: 1.11 mg/dL (ref 0.40–1.50)
Calcium: 9.7 mg/dL (ref 8.4–10.5)
Chloride: 102 mEq/L (ref 96–112)
GFR: 75.11 mL/min (ref >60.00)
Glucose, Bld: 89 mg/dL (ref 70–99)
Potassium: 3.9 mEq/L (ref 3.5–5.1)
SODIUM: 140 meq/L (ref 135–145)

## 2016-07-28 LAB — CBC WITH DIFFERENTIAL/PLATELET
Basophils Absolute: 0.1 10*3/uL (ref 0.0–0.1)
Basophils Relative: 0.8 % (ref 0.0–3.0)
EOS ABS: 0.1 10*3/uL (ref 0.0–0.7)
Eosinophils Relative: 0.9 % (ref 0.0–5.0)
HCT: 45.2 % (ref 39.0–52.0)
HEMOGLOBIN: 15.4 g/dL (ref 13.0–17.0)
Lymphocytes Relative: 29.2 % (ref 12.0–46.0)
Lymphs Abs: 1.9 10*3/uL (ref 0.7–4.0)
MCHC: 34.1 g/dL (ref 30.0–36.0)
MCV: 85.6 fl (ref 78.0–100.0)
MONO ABS: 0.6 10*3/uL (ref 0.1–1.0)
Monocytes Relative: 9.1 % (ref 3.0–12.0)
Neutro Abs: 3.9 10*3/uL (ref 1.4–7.7)
Neutrophils Relative %: 60 % (ref 43.0–77.0)
Platelets: 268 10*3/uL (ref 150.0–400.0)
RBC: 5.28 Mil/uL (ref 4.22–5.81)
RDW: 13.2 % (ref 11.5–15.5)
WBC: 6.6 10*3/uL (ref 4.0–10.5)

## 2016-07-28 LAB — LIPID PANEL
Cholesterol: 190 mg/dL (ref 0–200)
HDL: 43 mg/dL (ref >39.00)
LDL Cholesterol: 108 mg/dL — ABNORMAL HIGH (ref 0–99)
NonHDL: 146.79
Total CHOL/HDL Ratio: 4
Triglycerides: 193 mg/dL — ABNORMAL HIGH (ref 0.0–149.0)
VLDL: 38.6 mg/dL (ref 0.0–40.0)

## 2016-07-28 LAB — TSH: TSH: 2.58 u[IU]/mL (ref 0.35–4.50)

## 2016-07-28 NOTE — Progress Notes (Signed)
   Subjective:    Patient ID: Aaron Mahoney, male    DOB: 10/06/1970, 46 y.o.   MRN: [id number]  HPI Here for a well exam. He feels fine. We have a borderline high BP today, but he says at home he normally runs in the 130s over 80s.    Review of Systems  Constitutional: Negative.   HENT: Negative.   Eyes: Negative.   Respiratory: Negative.   Cardiovascular: Negative.   Gastrointestinal: Negative.   Genitourinary: Negative.   Musculoskeletal: Negative.   Skin: Negative.   Neurological: Negative.   Psychiatric/Behavioral: Negative.        Objective:   Physical Exam  Constitutional: He is oriented to person, place, and time. He appears well-developed and well-nourished. No distress.  HENT:  Head: Normocephalic and atraumatic.  Right Ear: External ear normal.  Left Ear: External ear normal.  Nose: Nose normal.  Mouth/Throat: Oropharynx is clear and moist. No oropharyngeal exudate.  Eyes: Pupils are equal, round, and reactive to light. Conjunctivae and EOM are normal. Right eye exhibits no discharge. Left eye exhibits no discharge. No scleral icterus.  Neck: Neck supple. No JVD present. No tracheal deviation present. No thyromegaly present.  Cardiovascular: Normal rate, regular rhythm, normal heart sounds and intact distal pulses.  Exam reveals no gallop and no friction rub.   No murmur heard. Pulmonary/Chest: Effort normal and breath sounds normal. No respiratory distress. He has no wheezes. He has no rales. He exhibits no tenderness.  Abdominal: Soft. Bowel sounds are normal. He exhibits no distension and no mass. There is no tenderness. There is no rebound and no guarding.  Genitourinary: Rectum normal, prostate normal and penis normal. Rectal exam shows guaiac negative stool. No penile tenderness.  Musculoskeletal: Normal range of motion. He exhibits no edema or tenderness.  Lymphadenopathy:    He has no cervical adenopathy.  Neurological: He is alert and oriented to person,  place, and time. He has normal reflexes. No cranial nerve deficit. He exhibits normal muscle tone. Coordination normal.  Skin: Skin is warm and dry. No rash noted. He is not diaphoretic. No erythema. No pallor.  Psychiatric: He has a normal mood and affect. His behavior is normal. Judgment and thought content normal.          Assessment & Plan:  Well exam. We discussed diet and exercise. Get fasting labs. Hie will continue to monitor the BP at home. Gershon CraneStephen Daruis Swaim, MD

## 2016-07-28 NOTE — Patient Instructions (Signed)
WE NOW OFFER   [facility] FAST TRACK!!!  SAME DAY Appointments for ACUTE CARE  Such as: Sprains, Injuries, cuts, abrasions, rashes, muscle pain, joint pain, back pain Colds, flu, sore throats, headache, allergies, cough, fever  Ear pain, sinus and eye infections Abdominal pain, nausea, vomiting, diarrhea, upset stomach Animal/insect bites  3 Easy Ways to Schedule: Walk-In Scheduling Call in scheduling Mychart Sign-up: [email or web address]         

## 2017-03-01 ENCOUNTER — Emergency Department (HOSPITAL_BASED_OUTPATIENT_CLINIC_OR_DEPARTMENT_OTHER)
Admission: EM | Admit: 2017-03-01 | Discharge: 2017-03-01 | Disposition: A | Discharge status: Home / Self Care | Payer: Managed Care, Other (non HMO) | Attending: Emergency Medicine | Admitting: Emergency Medicine

## 2017-03-01 ENCOUNTER — Other Ambulatory Visit: Payer: Self-pay

## 2017-03-01 ENCOUNTER — Encounter (HOSPITAL_BASED_OUTPATIENT_CLINIC_OR_DEPARTMENT_OTHER): Payer: Self-pay | Admitting: Emergency Medicine

## 2017-03-01 DIAGNOSIS — N23 Unspecified renal colic: Secondary | ICD-10-CM | POA: Diagnosis not present

## 2017-03-01 DIAGNOSIS — R109 Unspecified abdominal pain: Secondary | ICD-10-CM | POA: Diagnosis present

## 2017-03-01 LAB — URINALYSIS, ROUTINE W REFLEX MICROSCOPIC
Bilirubin Urine: NEGATIVE
GLUCOSE, UA: NEGATIVE mg/dL
Ketones, ur: NEGATIVE mg/dL
Leukocytes, UA: NEGATIVE
Nitrite: NEGATIVE
Protein, ur: NEGATIVE mg/dL
SPECIFIC GRAVITY, URINE: 1.025 (ref 1.005–1.030)
pH: 6 (ref 5.0–8.0)

## 2017-03-01 LAB — URINALYSIS, MICROSCOPIC (REFLEX)

## 2017-03-01 MED ORDER — FENTANYL CITRATE (PF) 100 MCG/2ML IJ SOLN
INTRAMUSCULAR | Status: AC
  Administered 2017-03-01: 50 ug
  Filled 2017-03-01: qty 2

## 2017-03-01 MED ORDER — TAMSULOSIN HCL 0.4 MG PO CAPS
0.4000 mg | ORAL_CAPSULE | Freq: Once | ORAL | Status: AC
  Administered 2017-03-01: 0.4 mg via ORAL
  Filled 2017-03-01: qty 1

## 2017-03-01 MED ORDER — ONDANSETRON HCL 4 MG/2ML IJ SOLN
INTRAMUSCULAR | Status: AC
  Administered 2017-03-01: 4 mg
  Filled 2017-03-01: qty 2

## 2017-03-01 MED ORDER — TAMSULOSIN HCL 0.4 MG PO CAPS: 0.4000 mg | ORAL_CAPSULE | Freq: Every day | ORAL | 0 refills | Status: AC

## 2017-03-01 MED ORDER — KETOROLAC TROMETHAMINE 15 MG/ML IJ SOLN
15.0000 mg | Freq: Once | INTRAMUSCULAR | Status: AC
  Administered 2017-03-01: 15 mg via INTRAVENOUS
  Filled 2017-03-01: qty 1

## 2017-03-01 MED ORDER — IBUPROFEN 600 MG PO TABS: 600.0000 mg | ORAL_TABLET | Freq: Four times a day (QID) | ORAL | 0 refills | Status: DC | PRN

## 2017-03-01 NOTE — ED Triage Notes (Signed)
Flank pain with nausea

## 2017-03-01 NOTE — ED Provider Notes (Signed)
[facility] EMERGENCY DEPARTMENT Provider Note  CSN: [id number] Arrival date & time: 03/01/17 1906  Chief Complaint(s) Flank Pain  HPI Aaron Mahoney is a 47 y.o. male   The history is provided by the patient.  Flank Pain  This is a recurrent problem. Episode onset: 1 week. The problem occurs constantly. Progression since onset: fluctuating. Pertinent negatives include no chest pain, no abdominal pain, no headaches and no shortness of breath. Nothing aggravates the symptoms. Nothing relieves the symptoms. He has tried nothing for the symptoms. The treatment provided no relief.    Past Medical History Past Medical History:  Diagnosis Date  . Chronic kidney disease    stone history   Patient Active Problem List   Diagnosis Date Noted  . Recurrent kidney stones 05/24/2016   Home Medication(s) Prior to Admission medications   Medication Sig Start Date End Date Taking? Authorizing Provider  ibuprofen (ADVIL,MOTRIN) 600 MG tablet Take 1 tablet (600 mg total) by mouth every 6 (six) hours as needed. 03/01/17   Nira Connardama, Pedro Eduardo, MD  tamsulosin (FLOMAX) 0.4 MG CAPS capsule Take 1 capsule (0.4 mg total) by mouth daily for 7 days. 03/01/17 03/08/17  Nira Connardama, Pedro Eduardo, MD                                                                                                                                    Past Surgical History History reviewed. No pertinent surgical history. Family History Family History  Problem Relation Age of Onset  . Hypertension Father   . Heart disease Father   . Hypertension Brother     Social History Social History   Tobacco Use  . Smoking status: Never Smoker  . Smokeless tobacco: Never Used  Substance Use Topics  . Alcohol use: No  . Drug use: No   Allergies Patient has no known allergies.  Review of Systems Review of Systems  Respiratory: Negative for shortness of breath.   Cardiovascular: Negative for chest pain.    Gastrointestinal: Negative for abdominal pain.  Genitourinary: Positive for flank pain.  Neurological: Negative for headaches.   All other systems are reviewed and are negative for acute change except as noted in the HPI  Physical Exam Vital Signs  I have reviewed the triage vital signs BP (!) 149/104 (BP Location: Right Arm)   Pulse 66   Temp (!) 97.4 F (36.3 C) (Oral)   Resp 20   Ht 5\' 11"  (1.803 m)   Wt 77.1 kg (170 lb)   SpO2 100%   BMI 23.71 kg/m   Physical Exam  Constitutional: He is oriented to person, place, and time. He appears well-developed and well-nourished. No distress.  HENT:  Head: Normocephalic and atraumatic.  Right Ear: External ear normal.  Left Ear: External ear normal.  Nose: Nose normal.  Mouth/Throat: Mucous membranes are normal. No trismus in the jaw.  Eyes: Conjunctivae and EOM are normal.  No scleral icterus.  Neck: Normal range of motion and phonation normal.  Cardiovascular: Normal rate and regular rhythm.  Pulmonary/Chest: Effort normal. No stridor. No respiratory distress.  Abdominal: He exhibits no distension. There is no tenderness. Hernia confirmed negative in the right inguinal area and confirmed negative in the left inguinal area.  Genitourinary: Testes normal and penis normal. Cremasteric reflex is present. Right testis shows no tenderness. Left testis shows no tenderness. Circumcised.  Musculoskeletal: Normal range of motion. He exhibits no edema.  Neurological: He is alert and oriented to person, place, and time.  Skin: He is not diaphoretic.  Psychiatric: He has a normal mood and affect. His behavior is normal.  Vitals reviewed.   ED Results and Treatments Labs (all labs ordered are listed, but only abnormal results are displayed) Labs Reviewed  URINALYSIS, ROUTINE W REFLEX MICROSCOPIC - Abnormal; Notable for the following components:      Result Value   Hgb urine dipstick LARGE (*)    All other components within normal limits   URINALYSIS, MICROSCOPIC (REFLEX) - Abnormal; Notable for the following components:   Bacteria, UA RARE (*)    Squamous Epithelial / LPF 0-5 (*)    All other components within normal limits                                                                                                                         EKG  EKG Interpretation  Date/Time:    Ventricular Rate:    PR Interval:    QRS Duration:   QT Interval:    QTC Calculation:   R Axis:     Text Interpretation:        Radiology No results found. Pertinent labs & imaging results that were available during my care of the patient were reviewed by me and considered in my medical decision making (see chart for details).  Medications Ordered in ED Medications  ketorolac (TORADOL) 15 MG/ML injection 15 mg (not administered)  tamsulosin (FLOMAX) capsule 0.4 mg (not administered)  fentaNYL (SUBLIMAZE) 100 MCG/2ML injection (50 mcg  Given 03/01/17 1932)  ondansetron (ZOFRAN) 4 MG/2ML injection (4 mg  Given 03/01/17 1933)                                                                                                                                    Procedures Procedures  (including critical care time)  Medical  Decision Making / ED Course I have reviewed the nursing notes for this encounter and the patient's prior records (if available in EHR or on provided paperwork).    Consistent with renal colic.  UA with hematuria and no evidence of urinary tract infection.  On exam no hernia or evidence of torsion.  Not consistent with appendicitis.  Low suspicion for serious intra-abdominal inflammatory/infectious process.  Treated with IV Toradol and fentanyl resulting in significant improvement in his pain.  The patient appears reasonably screened and/or stabilized for discharge and I doubt any other medical condition or other [facility] requiring further screening, evaluation, or treatment in the ED at this time prior to discharge.  The patient  is safe for discharge with strict return precautions.   Final Clinical Impression(s) / ED Diagnoses Final diagnoses:  Ureteral colic    Disposition: Discharge  Condition: Good  I have discussed the results, Dx and Tx plan with the patient who expressed understanding and agree(s) with the plan. Discharge instructions discussed at great length. The patient was given strict return precautions who verbalized understanding of the instructions. No further questions at time of discharge.    ED Discharge Orders        Ordered    ibuprofen (ADVIL,MOTRIN) 600 MG tablet  Every 6 hours PRN     03/01/17 2042    tamsulosin (FLOMAX) 0.4 MG CAPS capsule  Daily     03/01/17 2042       Follow Up: [facility] [address] [place] [address] [phone] Schedule an appointment as soon as possible for a visit  For close follow up to assess for renal colic     This chart was dictated using voice recognition software.  Despite best efforts to proofread,  errors can occur which can change the documentation meaning.   Nira Conn, MD 03/01/17 2043

## 2018-06-26 ENCOUNTER — Ambulatory Visit: Payer: Self-pay

## 2018-06-26 NOTE — Telephone Encounter (Signed)
Call placed to patient. Pt states that over the last 2-3 months he has noted that his BP is elevating.  Today 164/102 and about 15 minutes later 154/98.  He states that presently does not take medication for BP.  He states he started to check his BP because he has been getting headaches over left eye pain.  He denies weakness on one side of his body. He states that he is working from home and his boss though he looked pale today on a conference call.  He states he just does not feel well. He denies Chest pain and breathing issues.  Care advice read to patient. Pt verbalized understanding of all instructions and will go to Er if symptoms worsen.  Call will be routed to office for appointment in AM. Pt verbalized understanding. Reason for Disposition . Systolic BP  >= 751 OR Diastolic >= 700  Answer Assessment - Initial Assessment Questions 1. BLOOD PRESSURE: "What is the blood pressure?" "Did you take at least two measurements 5 minutes apart?"     164/102  154/98 2. ONSET: "When did you take your blood pressure?"    Couple months 3. HOW: "How did you obtain the blood pressure?" (e.g., visiting nurse, automatic home BP monitor)    Home meter 4. HISTORY: "Do you have a history of high blood pressure?"     No 5. MEDICATIONS: "Are you taking any medications for blood pressure?" "Have you missed any doses recently?"    None 6. OTHER SYMPTOMS: "Do you have any symptoms?" (e.g., headache, chest pain, blurred vision, difficulty breathing, weakness)     Headache left eye pain 7. PREGNANCY: "Is there any chance you are pregnant?" "When was your last menstrual period?"     N/A  Protocols used: HIGH BLOOD PRESSURE-A-AH

## 2018-06-27 ENCOUNTER — Ambulatory Visit (INDEPENDENT_AMBULATORY_CARE_PROVIDER_SITE_OTHER): Payer: Managed Care, Other (non HMO) | Admitting: Family Medicine

## 2018-06-27 ENCOUNTER — Encounter: Payer: Self-pay | Admitting: Family Medicine

## 2018-06-27 ENCOUNTER — Other Ambulatory Visit: Payer: Self-pay

## 2018-06-27 DIAGNOSIS — I10 Essential (primary) hypertension: Secondary | ICD-10-CM | POA: Diagnosis not present

## 2018-06-27 MED ORDER — LISINOPRIL 10 MG PO TABS: 10.0000 mg | ORAL_TABLET | Freq: Every day | ORAL | 3 refills | Status: DC

## 2018-06-27 NOTE — Telephone Encounter (Signed)
Called and spoke with pt and he is aware of appt at 1030 this morning

## 2018-06-27 NOTE — Telephone Encounter (Signed)
Do you want to see in-office or virtual?

## 2018-06-27 NOTE — Progress Notes (Signed)
   Subjective:    Patient ID: Aaron Mahoney, male    DOB: 1970/01/15, 48 y.o.   MRN: [id number]  HPI Virtual Visit via Video Note  I connected with the patient on 06/27/18 at 10:30 AM EDT by a video enabled telemedicine application and verified that I am speaking with the correct person using two identifiers.  Location patient: home Location provider:work or home office Persons participating in the virtual visit: patient, provider  I discussed the limitations of evaluation and management by telemedicine and the availability of in person appointments. The patient expressed understanding and agreed to proceed.   HPI: Here for elevated BP readings. He has had borderline BP for several years but in the past month he has been averaging 154 to 170/98 to110. He sometimes feels lightheaded and has mild headaches. No chest pain or SOB.    ROS: See pertinent positives and negatives per HPI.  Past Medical History:  Diagnosis Date  . Chronic kidney disease    stone history    History reviewed. No pertinent surgical history.  Family History  Problem Relation Age of Onset  . Hypertension Father   . Heart disease Father   . Hypertension Brother      Current Outpatient Medications:  .  ibuprofen (ADVIL,MOTRIN) 600 MG tablet, Take 1 tablet (600 mg total) by mouth every 6 (six) hours as needed., Disp: 30 tablet, Rfl: 0  EXAM:  VITALS per patient if applicable:  GENERAL: alert, oriented, appears well and in no acute distress  HEENT: atraumatic, conjunttiva clear, no obvious abnormalities on inspection of external nose and ears  NECK: normal movements of the head and neck  LUNGS: on inspection no signs of respiratory distress, breathing rate appears normal, no obvious gross SOB, gasping or wheezing  CV: no obvious cyanosis  MS: moves all visible extremities without noticeable abnormality  PSYCH/NEURO: pleasant and cooperative, no obvious depression or anxiety, speech and thought  processing grossly intact  ASSESSMENT AND PLAN: New onset HTN. We discussed diet and exercise advice. He will start on Lisinopril 10 mg daily. In 3-4 weeks he will come in for a well exam with labs, and we will reassess the BP.  Alysia Penna, MD  Discussed the following assessment and plan:  No diagnosis found.     I discussed the assessment and treatment plan with the patient. The patient was provided an opportunity to ask questions and all were answered. The patient agreed with the plan and demonstrated an understanding of the instructions.   The patient was advised to call back or seek an in-person evaluation if the symptoms worsen or if the condition fails to improve as anticipated.      Review of Systems     Objective:   Physical Exam        Assessment & Plan:

## 2018-07-31 ENCOUNTER — Other Ambulatory Visit: Payer: Self-pay

## 2018-07-31 ENCOUNTER — Ambulatory Visit (INDEPENDENT_AMBULATORY_CARE_PROVIDER_SITE_OTHER): Payer: Managed Care, Other (non HMO) | Admitting: Family Medicine

## 2018-07-31 ENCOUNTER — Encounter: Payer: Self-pay | Admitting: Family Medicine

## 2018-07-31 VITALS — BP 110/60 | HR 56 | Temp 97.9°F | Wt 173.0 lb

## 2018-07-31 DIAGNOSIS — Z0000 Encounter for general adult medical examination without abnormal findings: Secondary | ICD-10-CM

## 2018-07-31 LAB — HEPATIC FUNCTION PANEL
ALT: 25 U/L (ref 0–53)
AST: 20 U/L (ref 0–37)
Albumin: 4.7 g/dL (ref 3.5–5.2)
Alkaline Phosphatase: 72 U/L (ref 39–117)
Bilirubin, Direct: 0.2 mg/dL (ref 0.0–0.3)
Total Bilirubin: 1.5 mg/dL — ABNORMAL HIGH (ref 0.2–1.2)
Total Protein: 6.7 g/dL (ref 6.0–8.3)

## 2018-07-31 LAB — CBC WITH DIFFERENTIAL/PLATELET
Basophils Absolute: 0 10*3/uL (ref 0.0–0.1)
Basophils Relative: 0.7 % (ref 0.0–3.0)
Eosinophils Absolute: 0.1 10*3/uL (ref 0.0–0.7)
Eosinophils Relative: 1 % (ref 0.0–5.0)
HCT: 44.5 % (ref 39.0–52.0)
Hemoglobin: 15.2 g/dL (ref 13.0–17.0)
Lymphocytes Relative: 31.8 % (ref 12.0–46.0)
Lymphs Abs: 1.8 10*3/uL (ref 0.7–4.0)
MCHC: 34.1 g/dL (ref 30.0–36.0)
MCV: 86.1 fl (ref 78.0–100.0)
Monocytes Absolute: 0.5 10*3/uL (ref 0.1–1.0)
Monocytes Relative: 8.8 % (ref 3.0–12.0)
Neutro Abs: 3.3 10*3/uL (ref 1.4–7.7)
Neutrophils Relative %: 57.7 % (ref 43.0–77.0)
Platelets: 260 10*3/uL (ref 150.0–400.0)
RBC: 5.17 Mil/uL (ref 4.22–5.81)
RDW: 12.8 % (ref 11.5–15.5)
WBC: 5.7 10*3/uL (ref 4.0–10.5)

## 2018-07-31 LAB — POC URINALSYSI DIPSTICK (AUTOMATED)
Bilirubin, UA: NEGATIVE
Glucose, UA: NEGATIVE
Ketones, UA: NEGATIVE
Leukocytes, UA: NEGATIVE
Nitrite, UA: NEGATIVE
Protein, UA: NEGATIVE
Spec Grav, UA: 1.025 (ref 1.010–1.025)
Urobilinogen, UA: 0.2 E.U./dL
pH, UA: 6 (ref 5.0–8.0)

## 2018-07-31 LAB — BASIC METABOLIC PANEL
BUN: 17 mg/dL (ref 6–23)
CO2: 29 mEq/L (ref 19–32)
Calcium: 9.6 mg/dL (ref 8.4–10.5)
Chloride: 103 mEq/L (ref 96–112)
Creatinine, Ser: 1.12 mg/dL (ref 0.40–1.50)
GFR: 69.36 mL/min (ref >60.00)
Glucose, Bld: 88 mg/dL (ref 70–99)
Potassium: 4 mEq/L (ref 3.5–5.1)
Sodium: 139 mEq/L (ref 135–145)

## 2018-07-31 LAB — LIPID PANEL
Cholesterol: 187 mg/dL (ref 0–200)
HDL: 43.6 mg/dL (ref >39.00)
LDL Cholesterol: 110 mg/dL — ABNORMAL HIGH (ref 0–99)
NonHDL: 143.55
Total CHOL/HDL Ratio: 4
Triglycerides: 168 mg/dL — ABNORMAL HIGH (ref 0.0–149.0)
VLDL: 33.6 mg/dL (ref 0.0–40.0)

## 2018-07-31 LAB — PSA: PSA: 1.09 ng/mL (ref 0.10–4.00)

## 2018-07-31 LAB — TSH: TSH: 2.03 u[IU]/mL (ref 0.35–4.50)

## 2018-07-31 MED ORDER — LISINOPRIL 10 MG PO TABS: 10.0000 mg | ORAL_TABLET | Freq: Every day | ORAL | 3 refills | Status: DC

## 2018-07-31 NOTE — Progress Notes (Signed)
Subjective:    Patient ID: Aaron Mahoney, male    DOB: 1970-08-22, 48 y.o.   MRN: [id number]  HPI Here for a well exam. He feels great. Last month he had been getting high BP readings at home and some days he felt lightheaded. We started him on Lisinopril and now he feels great. His BP at home has been in the 110-120/70 range.    Review of Systems  Constitutional: Negative.   HENT: Negative.   Eyes: Negative.   Respiratory: Negative.   Cardiovascular: Negative.   Gastrointestinal: Negative.   Genitourinary: Negative.   Musculoskeletal: Negative.   Skin: Negative.   Neurological: Negative.   Psychiatric/Behavioral: Negative.        Objective:   Physical Exam Constitutional:      General: He is not in acute distress.    Appearance: He is well-developed. He is not diaphoretic.  HENT:     Head: Normocephalic and atraumatic.     Right Ear: External ear normal.     Left Ear: External ear normal.     Nose: Nose normal.     Mouth/Throat:     Pharynx: No oropharyngeal exudate.  Eyes:     General: No scleral icterus.       Right eye: No discharge.        Left eye: No discharge.     Conjunctiva/sclera: Conjunctivae normal.     Pupils: Pupils are equal, round, and reactive to light.  Neck:     Musculoskeletal: Neck supple.     Thyroid: No thyromegaly.     Vascular: No JVD.     Trachea: No tracheal deviation.  Cardiovascular:     Rate and Rhythm: Normal rate and regular rhythm.     Heart sounds: Normal heart sounds. No murmur. No friction rub. No gallop.   Pulmonary:     Effort: Pulmonary effort is normal. No respiratory distress.     Breath sounds: Normal breath sounds. No wheezing or rales.  Chest:     Chest wall: No tenderness.  Abdominal:     General: Bowel sounds are normal. There is no distension.     Palpations: Abdomen is soft. There is no mass.     Tenderness: There is no abdominal tenderness. There is no guarding or rebound.  Genitourinary:    Penis:  Normal. No tenderness.      Prostate: Normal.     Rectum: Normal. Guaiac result negative.  Musculoskeletal: Normal range of motion.        General: No tenderness.  Lymphadenopathy:     Cervical: No cervical adenopathy.  Skin:    General: Skin is warm and dry.     Coloration: Skin is not pale.     Findings: No erythema or rash.  Neurological:     Mental Status: He is alert and oriented to person, place, and time.     Cranial Nerves: No cranial nerve deficit.     Motor: No abnormal muscle tone.     Coordination: Coordination normal.     Deep Tendon Reflexes: Reflexes are normal and symmetric. Reflexes normal.  Psychiatric:        Behavior: Behavior normal.        Thought Content: Thought content normal.        Judgment: Judgment normal.           Assessment & Plan:  Well exam. We discussed diet and exercise. Get fasting labs. His HTN is now well controlled. Set up  a colonoscopy.  Gershon CraneStephen Peggy Loge, MD

## 2018-09-11 ENCOUNTER — Encounter: Payer: Self-pay | Admitting: Family Medicine

## 2020-05-11 DIAGNOSIS — D3132 Benign neoplasm of left choroid: Secondary | ICD-10-CM | POA: Diagnosis not present

## 2020-07-15 DIAGNOSIS — I10 Essential (primary) hypertension: Secondary | ICD-10-CM | POA: Diagnosis not present

## 2020-07-15 DIAGNOSIS — E161 Other hypoglycemia: Secondary | ICD-10-CM | POA: Diagnosis not present

## 2020-07-15 DIAGNOSIS — W19XXXA Unspecified fall, initial encounter: Secondary | ICD-10-CM | POA: Diagnosis not present

## 2020-07-15 DIAGNOSIS — E162 Hypoglycemia, unspecified: Secondary | ICD-10-CM | POA: Diagnosis not present

## 2020-07-16 ENCOUNTER — Other Ambulatory Visit: Payer: Self-pay

## 2020-07-16 ENCOUNTER — Encounter: Payer: Self-pay | Admitting: Family Medicine

## 2020-07-16 ENCOUNTER — Ambulatory Visit (INDEPENDENT_AMBULATORY_CARE_PROVIDER_SITE_OTHER): Payer: BC Managed Care – PPO | Admitting: Family Medicine

## 2020-07-16 VITALS — BP 148/98 | HR 64 | Temp 97.2°F | Wt 192.2 lb

## 2020-07-16 DIAGNOSIS — I10 Essential (primary) hypertension: Secondary | ICD-10-CM | POA: Diagnosis not present

## 2020-07-16 MED ORDER — LISINOPRIL 20 MG PO TABS: 20.0000 mg | ORAL_TABLET | Freq: Every day | ORAL | 3 refills | Status: DC

## 2020-07-16 NOTE — Progress Notes (Signed)
   Subjective:    Patient ID: Aaron Mahoney, male    DOB: 1970-05-02, 50 y.o.   MRN: [id number]  HPI Here to follow up after a fall yesterday and to follow up on HTN. We saw him 2 years ago and started him on lisinopril 10 mg daily. He did well on this for over a year, and his BP was stable at home. However about 4 months ago he decided he no longer needed it, so he stopped taking it. He did not recheck his BP after that until yesterday. At home yesterday while walking down a flight of steps he missed a step and fell. He bumped down several steps until he came to rest at the bottom. No head trauma or LOC. He felt lightheaded after that and his wife was worried, so she called EMS. When they arrived his exam was normal except his BP was up to 180/120. Over the next hour it settled down to 140/98. EMS asked him to see up today. Today he is sore and stiff all over. But he feels fine in general.    Review of Systems  Constitutional: Negative.   Respiratory: Negative.    Cardiovascular: Negative.   Neurological: Negative.       Objective:   Physical Exam Constitutional:      General: He is not in acute distress.    Appearance: Normal appearance.  Cardiovascular:     Rate and Rhythm: Normal rate and regular rhythm.     Pulses: Normal pulses.     Heart sounds: Normal heart sounds.  Pulmonary:     Effort: Pulmonary effort is normal.     Breath sounds: Normal breath sounds.  Musculoskeletal:     Right lower leg: No edema.     Left lower leg: No edema.  Neurological:     General: No focal deficit present.     Mental Status: He is alert and oriented to person, place, and time.          Assessment & Plan:  He seems to be fine after a fall yesterday. His HTN is not controlled however, so he will start on Lisinopril 20 mg daily. We will plan on seeing him in 4 weeks for a wellness exam, and we can follow up the BP at that time. We spent 35 minutes discussing these issues today. Gershon Crane, MD

## 2020-08-15 ENCOUNTER — Other Ambulatory Visit: Payer: Self-pay

## 2020-08-15 ENCOUNTER — Emergency Department (HOSPITAL_BASED_OUTPATIENT_CLINIC_OR_DEPARTMENT_OTHER)
Admission: EM | Admit: 2020-08-15 | Discharge: 2020-08-15 | Disposition: A | Discharge status: Home / Self Care | Payer: BC Managed Care – PPO | Attending: Emergency Medicine | Admitting: Emergency Medicine

## 2020-08-15 ENCOUNTER — Emergency Department (HOSPITAL_BASED_OUTPATIENT_CLINIC_OR_DEPARTMENT_OTHER): Payer: BC Managed Care – PPO

## 2020-08-15 ENCOUNTER — Encounter (HOSPITAL_BASED_OUTPATIENT_CLINIC_OR_DEPARTMENT_OTHER): Payer: Self-pay

## 2020-08-15 DIAGNOSIS — N189 Chronic kidney disease, unspecified: Secondary | ICD-10-CM | POA: Diagnosis not present

## 2020-08-15 DIAGNOSIS — R1032 Left lower quadrant pain: Secondary | ICD-10-CM | POA: Diagnosis not present

## 2020-08-15 DIAGNOSIS — K8020 Calculus of gallbladder without cholecystitis without obstruction: Secondary | ICD-10-CM | POA: Diagnosis not present

## 2020-08-15 DIAGNOSIS — Z79899 Other long term (current) drug therapy: Secondary | ICD-10-CM | POA: Diagnosis not present

## 2020-08-15 DIAGNOSIS — N200 Calculus of kidney: Secondary | ICD-10-CM | POA: Insufficient documentation

## 2020-08-15 DIAGNOSIS — M160 Bilateral primary osteoarthritis of hip: Secondary | ICD-10-CM | POA: Diagnosis not present

## 2020-08-15 DIAGNOSIS — I129 Hypertensive chronic kidney disease with stage 1 through stage 4 chronic kidney disease, or unspecified chronic kidney disease: Secondary | ICD-10-CM | POA: Diagnosis not present

## 2020-08-15 DIAGNOSIS — K5730 Diverticulosis of large intestine without perforation or abscess without bleeding: Secondary | ICD-10-CM | POA: Diagnosis not present

## 2020-08-15 LAB — URINALYSIS, ROUTINE W REFLEX MICROSCOPIC
Bilirubin Urine: NEGATIVE
Glucose, UA: NEGATIVE mg/dL
Ketones, ur: NEGATIVE mg/dL
Leukocytes,Ua: NEGATIVE
Nitrite: NEGATIVE
Protein, ur: 30 mg/dL — AB
Specific Gravity, Urine: >1.03 — ABNORMAL HIGH (ref 1.005–1.030)
pH: 6 (ref 5.0–8.0)

## 2020-08-15 LAB — COMPREHENSIVE METABOLIC PANEL
ALT: 42 U/L (ref 0–44)
AST: 26 U/L (ref 15–41)
Albumin: 4.3 g/dL (ref 3.5–5.0)
Alkaline Phosphatase: 79 U/L (ref 38–126)
Anion gap: 7 (ref 5–15)
BUN: 28 mg/dL — ABNORMAL HIGH (ref 6–20)
CO2: 28 mmol/L (ref 22–32)
Calcium: 9 mg/dL (ref 8.9–10.3)
Chloride: 103 mmol/L (ref 98–111)
Creatinine, Ser: 1.24 mg/dL (ref 0.61–1.24)
GFR, Estimated: >60 mL/min (ref >60)
Glucose, Bld: 103 mg/dL — ABNORMAL HIGH (ref 70–99)
Potassium: 3.7 mmol/L (ref 3.5–5.1)
Sodium: 138 mmol/L (ref 135–145)
Total Bilirubin: 1.2 mg/dL (ref 0.3–1.2)
Total Protein: 6.9 g/dL (ref 6.5–8.1)

## 2020-08-15 LAB — URINALYSIS, MICROSCOPIC (REFLEX): RBC / HPF: >50 RBC/hpf (ref 0–5)

## 2020-08-15 LAB — LIPASE, BLOOD: Lipase: 24 U/L (ref 11–51)

## 2020-08-15 LAB — CBC WITH DIFFERENTIAL/PLATELET
Abs Immature Granulocytes: 0.06 10*3/uL (ref 0.00–0.07)
Basophils Absolute: 0 10*3/uL (ref 0.0–0.1)
Basophils Relative: 0 %
Eosinophils Absolute: 0.1 10*3/uL (ref 0.0–0.5)
Eosinophils Relative: 1 %
HCT: 44.2 % (ref 39.0–52.0)
Hemoglobin: 15.2 g/dL (ref 13.0–17.0)
Immature Granulocytes: 1 %
Lymphocytes Relative: 18 %
Lymphs Abs: 2 10*3/uL (ref 0.7–4.0)
MCH: 29.3 pg (ref 26.0–34.0)
MCHC: 34.4 g/dL (ref 30.0–36.0)
MCV: 85.3 fL (ref 80.0–100.0)
Monocytes Absolute: 0.9 10*3/uL (ref 0.1–1.0)
Monocytes Relative: 8 %
Neutro Abs: 7.9 10*3/uL — ABNORMAL HIGH (ref 1.7–7.7)
Neutrophils Relative %: 72 %
Platelets: 274 10*3/uL (ref 150–400)
RBC: 5.18 MIL/uL (ref 4.22–5.81)
RDW: 12.3 % (ref 11.5–15.5)
WBC: 11 10*3/uL — ABNORMAL HIGH (ref 4.0–10.5)
nRBC: 0 % (ref 0.0–0.2)

## 2020-08-15 MED ORDER — KETOROLAC TROMETHAMINE 30 MG/ML IJ SOLN
30.0000 mg | Freq: Once | INTRAMUSCULAR | Status: DC
  Filled 2020-08-15: qty 1

## 2020-08-15 MED ORDER — SODIUM CHLORIDE 0.9 % IV BOLUS
1000.0000 mL | Freq: Once | INTRAVENOUS | Status: AC
  Administered 2020-08-15: 1000 mL via INTRAVENOUS

## 2020-08-15 MED ORDER — OXYCODONE-ACETAMINOPHEN 5-325 MG PO TABS: 1.0000 | ORAL_TABLET | Freq: Four times a day (QID) | ORAL | 0 refills | Status: DC | PRN

## 2020-08-15 MED ORDER — KETOROLAC TROMETHAMINE 30 MG/ML IJ SOLN
30.0000 mg | Freq: Once | INTRAMUSCULAR | Status: AC
  Administered 2020-08-15: 30 mg via INTRAVENOUS

## 2020-08-15 MED ORDER — TAMSULOSIN HCL 0.4 MG PO CAPS: 0.4000 mg | ORAL_CAPSULE | Freq: Every day | ORAL | 0 refills | Status: DC

## 2020-08-15 MED ORDER — ONDANSETRON 4 MG PO TBDP
8.0000 mg | ORAL_TABLET | Freq: Once | ORAL | Status: AC
  Administered 2020-08-15: 8 mg via ORAL
  Filled 2020-08-15: qty 2

## 2020-08-15 MED ORDER — SODIUM CHLORIDE 0.9 % IV BOLUS: 500.0000 mL | Freq: Once | INTRAVENOUS | Status: DC

## 2020-08-15 NOTE — ED Triage Notes (Signed)
Pt c/o left flank pain radiating into lower abdomen. States hx of kidney stones, feels similar. States had mild pain the past few days, worsened today.

## 2020-08-15 NOTE — ED Notes (Signed)
Taken to CT at this time. 

## 2020-08-15 NOTE — Discharge Instructions (Signed)
Continue to take Tylenol and ibuprofen as needed for pain control. If you need something stronger like Percocet please feel free to take it as needed. Take the flomax once daily to help pass the kidney stone. Please strain your urine until the stone passes.  Bring it to your primary care doctor as always for analysis.  Please follow-up with them next week. Please make a follow-up appointment with urology for further evaluation given this is her fourth kidney stone. If he develops signs of infection, if it does not pass, if things worsen you develop a fever, nausea, vomiting please return back to the ED for further evaluation.

## 2020-08-15 NOTE — ED Provider Notes (Signed)
[facility] EMERGENCY DEPARTMENT Provider Note   CSN: [id number] Arrival date & time: 08/15/20  1649     History Chief Complaint  Patient presents with   Flank Pain    Aaron Mahoney is a 50 y.o. male.   Flank Pain Pertinent negatives include no chest pain and no shortness of breath.   Patient presents with left flank pain x2 days.  Started acutely, pain is constant and usually sharp.  Radiates to the back and left lower quadrant.  Some nausea, denies straight emesis but states he has been regurgitating water.  No dysuria, fevers or hematuria.  He is taking 2 ibuprofen without relief.  Denies any aggravating factors.  History of 4 previous kidney stones, has not been seen by urology.  No previous abdominal surgeries.  Past Medical History:  Diagnosis Date   Chronic kidney disease    stone history    Patient Active Problem List   Diagnosis Date Noted   Essential hypertension 06/27/2018   Recurrent kidney stones 05/24/2016    History reviewed. No pertinent surgical history.     Family History  Problem Relation Age of Onset   Hypertension Father    Heart disease Father    Hypertension Brother     Social History   Tobacco Use   Smoking status: Never   Smokeless tobacco: Never  Substance Use Topics   Alcohol use: No   Drug use: No    Home Medications Prior to Admission medications   Medication Sig Start Date End Date Taking? Authorizing Provider  oxyCODONE-acetaminophen (PERCOCET/ROXICET) 5-325 MG tablet Take 1 tablet by mouth every 6 (six) hours as needed for severe pain. 08/15/20  Yes Theron Arista, PA-C  tamsulosin (FLOMAX) 0.4 MG CAPS capsule Take 1 capsule (0.4 mg total) by mouth daily. 08/15/20  Yes Theron Arista, PA-C  ibuprofen (ADVIL,MOTRIN) 600 MG tablet Take 1 tablet (600 mg total) by mouth every 6 (six) hours as needed. 03/01/17   Nira Conn, MD  lisinopril (ZESTRIL) 20 MG tablet Take 1 tablet (20 mg total) by mouth daily. 07/16/20    Nelwyn Salisbury, MD    Allergies    Patient has no known allergies.  Review of Systems   Review of Systems  Constitutional:  Negative for fever.  Respiratory:  Negative for shortness of breath.   Cardiovascular:  Negative for chest pain.  Gastrointestinal:  Positive for nausea and vomiting.  Genitourinary:  Positive for flank pain. Negative for dysuria, hematuria and urgency.  Musculoskeletal:  Positive for back pain.   Physical Exam Updated Vital Signs BP (!) 167/90 (BP Location: Left Arm)   Pulse (!) 53   Temp 98.7 F (37.1 C) (Oral)   Resp 18   Ht 5\' 11"  (1.803 m)   Wt 81.6 kg   SpO2 100%   BMI 25.10 kg/m   Physical Exam Vitals and nursing note reviewed. Exam conducted with a chaperone present.  Constitutional:      Appearance: Normal appearance.     Comments: Patient appears uncomfortable, unable to sit still  HENT:     Head: Normocephalic and atraumatic.  Eyes:     General: No scleral icterus.       Right eye: No discharge.        Left eye: No discharge.     Extraocular Movements: Extraocular movements intact.     Pupils: Pupils are equal, round, and reactive to light.  Cardiovascular:     Rate and Rhythm: Normal rate and  regular rhythm.     Pulses: Normal pulses.     Heart sounds: Normal heart sounds. No murmur heard.   No friction rub. No gallop.  Pulmonary:     Effort: Pulmonary effort is normal. No respiratory distress.     Breath sounds: Normal breath sounds.  Abdominal:     General: Abdomen is flat. Bowel sounds are normal. There is no distension.     Palpations: Abdomen is soft.     Tenderness: There is abdominal tenderness. There is left CVA tenderness.     Comments: No rigidity or peritoneal signs, but left CVA tenderness.  Skin:    General: Skin is warm and dry.     Coloration: Skin is not jaundiced.  Neurological:     Mental Status: He is alert. Mental status is at baseline.     Coordination: Coordination normal.    ED Results / Procedures  / Treatments   Labs (all labs ordered are listed, but only abnormal results are displayed) Labs Reviewed  URINALYSIS, ROUTINE W REFLEX MICROSCOPIC - Abnormal; Notable for the following components:      Result Value   Specific Gravity, Urine >1.030 (*)    Hgb urine dipstick LARGE (*)    Protein, ur 30 (*)    All other components within normal limits  URINALYSIS, MICROSCOPIC (REFLEX) - Abnormal; Notable for the following components:   Bacteria, UA MANY (*)    All other components within normal limits  CBC WITH DIFFERENTIAL/PLATELET - Abnormal; Notable for the following components:   WBC 11.0 (*)    Neutro Abs 7.9 (*)    All other components within normal limits  COMPREHENSIVE METABOLIC PANEL - Abnormal; Notable for the following components:   Glucose, Bld 103 (*)    BUN 28 (*)    All other components within normal limits  LIPASE, BLOOD    EKG None  Radiology CT RENAL STONE STUDY  Result Date: 08/15/2020 CLINICAL DATA:  Left flank pain radiating to the lower abdomen history of renal stones. Patient states pain feels similar. EXAM: CT ABDOMEN AND PELVIS WITHOUT CONTRAST TECHNIQUE: Multidetector CT imaging of the abdomen and pelvis was performed following the standard protocol without IV contrast. COMPARISON:  CT May 22, 2013 FINDINGS: Lower chest: No acute abnormality. Hepatobiliary: Unremarkable noncontrast appearance of the hepatic parenchyma. Cholelithiasis without findings of acute cholecystitis. No biliary ductal dilation. Pancreas: Unremarkable noncontrast appearance of the pancreatic parenchyma. Spleen: Within normal limits. Adrenals/Urinary Tract: Bilateral adrenal glands are unremarkable. Mild prominence of the left renal pelvis and lower pole calices to the level of a 6 mm stone at the UVJ. No right-sided hydronephrosis. Punctate nonobstructive right interpolar renal stones. Urinary bladder is unremarkable for degree of distension. Stomach/Bowel: Stomach is within normal limits.  Appendix appears normal. Colonic diverticulosis without findings of acute diverticulitis. No evidence of bowel wall thickening, distention, or inflammatory changes. Vascular/Lymphatic: No abdominal aortic aneurysm. No pathologically enlarged abdominal or pelvic lymph nodes. Reproductive: Prostate is unremarkable. Other: No abdominopelvic ascites. Musculoskeletal: Multilevel degenerative changes spine. Degenerative changes bilateral hips. No acute osseous abnormality. IMPRESSION: 1. Mild prominence of the left renal pelvis and lower pole calices to the level of a 6 mm stone at the left UVJ. 2. Punctate nonobstructive right renal stones. 3. Colonic diverticulosis without findings of acute diverticulitis. 4. Cholelithiasis without findings of acute cholecystitis. Electronically Signed   By: Maudry Mayhew MD   On: 08/15/2020 18:15    Procedures Procedures   Medications Ordered in ED Medications  sodium chloride 0.9 % bolus 1,000 mL (0 mLs Intravenous Stopped 08/15/20 1853)  ondansetron (ZOFRAN-ODT) disintegrating tablet 8 mg (8 mg Oral Given 08/15/20 1753)  ketorolac (TORADOL) 30 MG/ML injection 30 mg (30 mg Intravenous Given 08/15/20 1756)    ED Course  I have reviewed the triage vital signs and the nursing notes.  Pertinent labs & imaging results that were available during my care of the patient were reviewed by me and considered in my medical decision making (see chart for details).  Clinical Course as of 08/15/20 2015  Sat Aug 15, 2020  1735 Lipase, blood Doubt pancreatitis [HS]  1827 Comprehensive metabolic panel(!) No electrolyte derangement, BUN is slightly elevated compared to previously but creatinine is normal so no AKI [HS]  1829 CBC with Differential(!) No anemia, mild leukocytosis today not suspect any infected kidney stone given that he is also afebrile this is a very mild elevation likely due to reactionary phase reactions. [HS]  2014 Urinalysis, Microscopic (reflex)(!) Hemoglobin  consistent with a kidney stone.  No signs of urinary tract infection. [HS]    Clinical Course User Index [HS] Theron Arista, PA-C   MDM Rules/Calculators/A&P                           Please see ED course for interpretation of labs as they relate to the differential.  Patient presentation is suspicious for kidney stone, especially given his history of recurrent kidney stones.  Differential also includes diverticulitis, cholelithiasis, muscle sprain, pancreatitis, as well as others.  Will initiate work-up and CT to evaluate for renal stone.  Will treat pain and nausea.  Work-up is revealing for 6 mm nephroliths in the left kidney.  His work-up does not show any signs of infection such as significantly elevated leukocytosis or fever.  His creatinine level is normal, do not think he needs urology evaluation at this time.  His vitals are stable, pain is well controlled.  Patient is appropriate for discharge home with Flomax, urine strainer, short dose of narcotics for pain control if needed. Advised him to follow-up with urology given frequency he has had these kidney stones and he verbalized understanding.  We discussed return precautions, he verbalized understanding.   Final Clinical Impression(s) / ED Diagnoses Final diagnoses:  Nephrolithiasis    Rx / DC Orders ED Discharge Orders          Ordered    tamsulosin (FLOMAX) 0.4 MG CAPS capsule  Daily        08/15/20 1838    oxyCODONE-acetaminophen (PERCOCET/ROXICET) 5-325 MG tablet  Every 6 hours PRN        08/15/20 1838             Theron Arista, PA-C 08/15/20 2015    Benjiman Core, MD 08/16/20 0005

## 2020-08-16 ENCOUNTER — Emergency Department (HOSPITAL_BASED_OUTPATIENT_CLINIC_OR_DEPARTMENT_OTHER)
Admission: EM | Admit: 2020-08-16 | Discharge: 2020-08-16 | Disposition: A | Discharge status: Home / Self Care | Payer: BC Managed Care – PPO | Attending: Emergency Medicine | Admitting: Emergency Medicine

## 2020-08-16 DIAGNOSIS — Z79899 Other long term (current) drug therapy: Secondary | ICD-10-CM | POA: Insufficient documentation

## 2020-08-16 DIAGNOSIS — N189 Chronic kidney disease, unspecified: Secondary | ICD-10-CM | POA: Insufficient documentation

## 2020-08-16 DIAGNOSIS — I129 Hypertensive chronic kidney disease with stage 1 through stage 4 chronic kidney disease, or unspecified chronic kidney disease: Secondary | ICD-10-CM | POA: Diagnosis not present

## 2020-08-16 DIAGNOSIS — N200 Calculus of kidney: Secondary | ICD-10-CM | POA: Diagnosis not present

## 2020-08-16 DIAGNOSIS — R109 Unspecified abdominal pain: Secondary | ICD-10-CM | POA: Diagnosis not present

## 2020-08-16 LAB — URINALYSIS, MICROSCOPIC (REFLEX): RBC / HPF: >50 RBC/hpf (ref 0–5)

## 2020-08-16 LAB — BASIC METABOLIC PANEL
Anion gap: 9 (ref 5–15)
BUN: 26 mg/dL — ABNORMAL HIGH (ref 6–20)
CO2: 23 mmol/L (ref 22–32)
Calcium: 9.1 mg/dL (ref 8.9–10.3)
Chloride: 107 mmol/L (ref 98–111)
Creatinine, Ser: 1.28 mg/dL — ABNORMAL HIGH (ref 0.61–1.24)
GFR, Estimated: >60 mL/min (ref >60)
Glucose, Bld: 93 mg/dL (ref 70–99)
Potassium: 3.9 mmol/L (ref 3.5–5.1)
Sodium: 139 mmol/L (ref 135–145)

## 2020-08-16 LAB — URINALYSIS, ROUTINE W REFLEX MICROSCOPIC
Bilirubin Urine: NEGATIVE
Glucose, UA: NEGATIVE mg/dL
Ketones, ur: NEGATIVE mg/dL
Leukocytes,Ua: NEGATIVE
Nitrite: NEGATIVE
Protein, ur: 30 mg/dL — AB
Specific Gravity, Urine: >1.03 — ABNORMAL HIGH (ref 1.005–1.030)
pH: 5.5 (ref 5.0–8.0)

## 2020-08-16 MED ORDER — FENTANYL CITRATE (PF) 100 MCG/2ML IJ SOLN
50.0000 ug | Freq: Once | INTRAMUSCULAR | Status: AC
  Administered 2020-08-16: 50 ug via INTRAVENOUS
  Filled 2020-08-16: qty 2

## 2020-08-16 MED ORDER — KETOROLAC TROMETHAMINE 30 MG/ML IJ SOLN
15.0000 mg | Freq: Once | INTRAMUSCULAR | Status: AC
  Administered 2020-08-16: 15 mg via INTRAVENOUS
  Filled 2020-08-16: qty 1

## 2020-08-16 MED ORDER — SODIUM CHLORIDE 0.9 % IV BOLUS
1000.0000 mL | Freq: Once | INTRAVENOUS | Status: AC
  Administered 2020-08-16: 1000 mL via INTRAVENOUS

## 2020-08-16 NOTE — ED Triage Notes (Signed)
Pt dx with kidney stone yesterday. Pt was unable to get prescriptions filled due to pharmacy hours yesterday. Here today for increase in pain in left flank.

## 2020-08-16 NOTE — ED Provider Notes (Signed)
[facility] EMERGENCY DEPARTMENT Provider Note   CSN: [id number] Arrival date & time: 08/16/20  7829     History Chief Complaint  Patient presents with   Flank Pain    Aaron Mahoney is a 50 y.o. male.  Patient is a 50 year old male who presents with renal colic.  He was diagnosed with a kidney stone yesterday.  He has a 6 mm stone in his left ureter on CT scan.  He said he was unable to get his prescription filled because the pharmacy had closed last night.  He said he was doing okay until early this morning when he started having some intense pain in his left flank.  Its the same pain as yesterday.  He has had some nausea and dry heaves but no other vomiting.  No urinary symptoms.  No fevers.  He took 3 ibuprofen prior to arrival at about 330 this morning.      Past Medical History:  Diagnosis Date   Chronic kidney disease    stone history    Patient Active Problem List   Diagnosis Date Noted   Essential hypertension 06/27/2018   Recurrent kidney stones 05/24/2016    No past surgical history on file.     Family History  Problem Relation Age of Onset   Hypertension Father    Heart disease Father    Hypertension Brother     Social History   Tobacco Use   Smoking status: Never   Smokeless tobacco: Never  Substance Use Topics   Alcohol use: No   Drug use: No    Home Medications Prior to Admission medications   Medication Sig Start Date End Date Taking? Authorizing Provider  ibuprofen (ADVIL,MOTRIN) 600 MG tablet Take 1 tablet (600 mg total) by mouth every 6 (six) hours as needed. 03/01/17   Nira Conn, MD  lisinopril (ZESTRIL) 20 MG tablet Take 1 tablet (20 mg total) by mouth daily. 07/16/20   Nelwyn Salisbury, MD  oxyCODONE-acetaminophen (PERCOCET/ROXICET) 5-325 MG tablet Take 1 tablet by mouth every 6 (six) hours as needed for severe pain. 08/15/20   Theron Arista, PA-C  tamsulosin (FLOMAX) 0.4 MG CAPS capsule Take 1 capsule (0.4 mg total) by  mouth daily. 08/15/20   Theron Arista, PA-C    Allergies    Patient has no known allergies.  Review of Systems   Review of Systems  Constitutional:  Negative for chills, diaphoresis, fatigue and fever.  HENT:  Negative for congestion, rhinorrhea and sneezing.   Eyes: Negative.   Respiratory:  Negative for cough, chest tightness and shortness of breath.   Cardiovascular:  Negative for chest pain and leg swelling.  Gastrointestinal:  Positive for abdominal pain and nausea. Negative for blood in stool, diarrhea and vomiting.  Genitourinary:  Positive for flank pain. Negative for difficulty urinating, frequency and hematuria.  Musculoskeletal:  Negative for arthralgias and back pain.  Skin:  Negative for rash.  Neurological:  Negative for dizziness, speech difficulty, weakness, numbness and headaches.   Physical Exam Updated Vital Signs BP 133/79   Pulse (!) 47   Temp 97.9 F (36.6 C) (Oral)   Resp 20   SpO2 98%   Physical Exam Constitutional:      Appearance: He is well-developed.  HENT:     Head: Normocephalic and atraumatic.  Eyes:     Pupils: Pupils are equal, round, and reactive to light.  Cardiovascular:     Rate and Rhythm: Normal rate and regular rhythm.  Heart sounds: Normal heart sounds.  Pulmonary:     Effort: Pulmonary effort is normal. No respiratory distress.     Breath sounds: Normal breath sounds. No wheezing or rales.  Chest:     Chest wall: No tenderness.  Abdominal:     General: Bowel sounds are normal.     Palpations: Abdomen is soft.     Tenderness: There is abdominal tenderness (Tenderness to the left flank). There is no guarding or rebound.  Musculoskeletal:        General: Normal range of motion.     Cervical back: Normal range of motion and neck supple.  Lymphadenopathy:     Cervical: No cervical adenopathy.  Skin:    General: Skin is warm and dry.     Findings: No rash.  Neurological:     Mental Status: He is alert and oriented to person,  place, and time.    ED Results / Procedures / Treatments   Labs (all labs ordered are listed, but only abnormal results are displayed) Labs Reviewed  BASIC METABOLIC PANEL - Abnormal; Notable for the following components:      Result Value   BUN 26 (*)    Creatinine, Ser 1.28 (*)    All other components within normal limits  URINALYSIS, ROUTINE W REFLEX MICROSCOPIC - Abnormal; Notable for the following components:   APPearance CLOUDY (*)    Specific Gravity, Urine >1.030 (*)    Hgb urine dipstick LARGE (*)    Protein, ur 30 (*)    All other components within normal limits  URINALYSIS, MICROSCOPIC (REFLEX) - Abnormal; Notable for the following components:   Bacteria, UA MANY (*)    All other components within normal limits    EKG None  Radiology CT RENAL STONE STUDY  Result Date: 08/15/2020 CLINICAL DATA:  Left flank pain radiating to the lower abdomen history of renal stones. Patient states pain feels similar. EXAM: CT ABDOMEN AND PELVIS WITHOUT CONTRAST TECHNIQUE: Multidetector CT imaging of the abdomen and pelvis was performed following the standard protocol without IV contrast. COMPARISON:  CT May 22, 2013 FINDINGS: Lower chest: No acute abnormality. Hepatobiliary: Unremarkable noncontrast appearance of the hepatic parenchyma. Cholelithiasis without findings of acute cholecystitis. No biliary ductal dilation. Pancreas: Unremarkable noncontrast appearance of the pancreatic parenchyma. Spleen: Within normal limits. Adrenals/Urinary Tract: Bilateral adrenal glands are unremarkable. Mild prominence of the left renal pelvis and lower pole calices to the level of a 6 mm stone at the UVJ. No right-sided hydronephrosis. Punctate nonobstructive right interpolar renal stones. Urinary bladder is unremarkable for degree of distension. Stomach/Bowel: Stomach is within normal limits. Appendix appears normal. Colonic diverticulosis without findings of acute diverticulitis. No evidence of bowel wall  thickening, distention, or inflammatory changes. Vascular/Lymphatic: No abdominal aortic aneurysm. No pathologically enlarged abdominal or pelvic lymph nodes. Reproductive: Prostate is unremarkable. Other: No abdominopelvic ascites. Musculoskeletal: Multilevel degenerative changes spine. Degenerative changes bilateral hips. No acute osseous abnormality. IMPRESSION: 1. Mild prominence of the left renal pelvis and lower pole calices to the level of a 6 mm stone at the left UVJ. 2. Punctate nonobstructive right renal stones. 3. Colonic diverticulosis without findings of acute diverticulitis. 4. Cholelithiasis without findings of acute cholecystitis. Electronically Signed   By: Maudry Mayhew MD   On: 08/15/2020 18:15    Procedures Procedures   Medications Ordered in ED Medications  fentaNYL (SUBLIMAZE) injection 50 mcg (50 mcg Intravenous Given 08/16/20 0807)  ketorolac (TORADOL) 30 MG/ML injection 15 mg (15 mg Intravenous Given  08/16/20 0807)  sodium chloride 0.9 % bolus 1,000 mL (0 mLs Intravenous Stopped 08/16/20 1042)  fentaNYL (SUBLIMAZE) injection 50 mcg (50 mcg Intravenous Given 08/16/20 1601)    ED Course  I have reviewed the triage vital signs and the nursing notes.  Pertinent labs & imaging results that were available during my care of the patient were reviewed by me and considered in my medical decision making (see chart for details).    MDM Rules/Calculators/A&P                           Patient is a 50 year old male who presents with recurrent pain after diagnosed with a kidney stone yesterday.  His pain has been controlled in the ED.  His urine has bacteria but no other suggestions of infection.  He is afebrile.  His creatinine is mildly elevated.  He was advised on this and that this will need to have close following.  He was advised to use caution with NSAIDs.  He was advised to make an appointment to follow-up with [facility].  He has prescriptions for Flomax and hydrocodone to  pick up at the pharmacy that he was given yesterday.  Strict return precautions were given. Final Clinical Impression(s) / ED Diagnoses Final diagnoses:  Kidney stone    Rx / DC Orders ED Discharge Orders     None        Rolan Bucco, MD 08/16/20 1149

## 2020-08-16 NOTE — ED Notes (Signed)
Pt ambulatory to waiting room. Pt verbalized understanding of discharge instructions.   

## 2020-08-31 ENCOUNTER — Other Ambulatory Visit: Payer: Self-pay

## 2020-09-01 ENCOUNTER — Ambulatory Visit (INDEPENDENT_AMBULATORY_CARE_PROVIDER_SITE_OTHER): Payer: BC Managed Care – PPO | Admitting: Family Medicine

## 2020-09-01 ENCOUNTER — Encounter: Payer: Self-pay | Admitting: Family Medicine

## 2020-09-01 VITALS — BP 110/70 | HR 65 | Temp 98.5°F | Ht 71.0 in | Wt 194.0 lb

## 2020-09-01 DIAGNOSIS — Z0000 Encounter for general adult medical examination without abnormal findings: Secondary | ICD-10-CM

## 2020-09-01 DIAGNOSIS — Z125 Encounter for screening for malignant neoplasm of prostate: Secondary | ICD-10-CM | POA: Diagnosis not present

## 2020-09-01 LAB — CBC WITH DIFFERENTIAL/PLATELET
Basophils Absolute: 0.1 10*3/uL (ref 0.0–0.1)
Basophils Relative: 0.7 % (ref 0.0–3.0)
Eosinophils Absolute: 0.1 10*3/uL (ref 0.0–0.7)
Eosinophils Relative: 1.6 % (ref 0.0–5.0)
HCT: 43.1 % (ref 39.0–52.0)
Hemoglobin: 14.6 g/dL (ref 13.0–17.0)
Lymphocytes Relative: 31.2 % (ref 12.0–46.0)
Lymphs Abs: 2.3 10*3/uL (ref 0.7–4.0)
MCHC: 33.9 g/dL (ref 30.0–36.0)
MCV: 85.6 fl (ref 78.0–100.0)
Monocytes Absolute: 0.7 10*3/uL (ref 0.1–1.0)
Monocytes Relative: 8.7 % (ref 3.0–12.0)
Neutro Abs: 4.3 10*3/uL (ref 1.4–7.7)
Neutrophils Relative %: 57.8 % (ref 43.0–77.0)
Platelets: 310 10*3/uL (ref 150.0–400.0)
RBC: 5.03 Mil/uL (ref 4.22–5.81)
RDW: 12.8 % (ref 11.5–15.5)
WBC: 7.5 10*3/uL (ref 4.0–10.5)

## 2020-09-01 LAB — HEPATIC FUNCTION PANEL
ALT: 46 U/L (ref 0–53)
AST: 30 U/L (ref 0–37)
Albumin: 4.4 g/dL (ref 3.5–5.2)
Alkaline Phosphatase: 85 U/L (ref 39–117)
Bilirubin, Direct: 0.2 mg/dL (ref 0.0–0.3)
Total Bilirubin: 1.1 mg/dL (ref 0.2–1.2)
Total Protein: 6.9 g/dL (ref 6.0–8.3)

## 2020-09-01 LAB — LIPID PANEL
Cholesterol: 216 mg/dL — ABNORMAL HIGH (ref 0–200)
HDL: 40.9 mg/dL (ref >39.00)
LDL Cholesterol: 135 mg/dL — ABNORMAL HIGH (ref 0–99)
NonHDL: 174.76
Total CHOL/HDL Ratio: 5
Triglycerides: 199 mg/dL — ABNORMAL HIGH (ref 0.0–149.0)
VLDL: 39.8 mg/dL (ref 0.0–40.0)

## 2020-09-01 LAB — TSH: TSH: 3.21 u[IU]/mL (ref 0.35–5.50)

## 2020-09-01 LAB — BASIC METABOLIC PANEL
BUN: 17 mg/dL (ref 6–23)
CO2: 28 mEq/L (ref 19–32)
Calcium: 9.7 mg/dL (ref 8.4–10.5)
Chloride: 101 mEq/L (ref 96–112)
Creatinine, Ser: 1.2 mg/dL (ref 0.40–1.50)
GFR: 69.67 mL/min (ref >60.00)
Glucose, Bld: 82 mg/dL (ref 70–99)
Potassium: 4.2 mEq/L (ref 3.5–5.1)
Sodium: 137 mEq/L (ref 135–145)

## 2020-09-01 LAB — T4, FREE: Free T4: 0.82 ng/dL (ref 0.60–1.60)

## 2020-09-01 LAB — T3, FREE: T3, Free: 3.5 pg/mL (ref 2.3–4.2)

## 2020-09-01 LAB — HEMOGLOBIN A1C: Hgb A1c MFr Bld: 5.3 % (ref 4.6–6.5)

## 2020-09-01 LAB — PSA: PSA: 0.9 ng/mL (ref 0.10–4.00)

## 2020-09-01 MED ORDER — TAMSULOSIN HCL 0.4 MG PO CAPS: 0.4000 mg | ORAL_CAPSULE | Freq: Every day | ORAL | 5 refills | Status: AC

## 2020-09-01 NOTE — Progress Notes (Signed)
Subjective:    Patient ID: Aaron Mahoney, male    DOB: December 26, 1970, 50 y.o.   MRN: [id number]  HPI Here for a well exam. He has no complaints today. He was in the ER on 807022 for a 6 mm stone in the left ureter. He was started on Flomax and he was able to pass a small amount of material he describes as "fine sand". The flank pain has resolved but he is not sure if there is any material left in the ureter or not. Also we started him on Lisinopril last month and his BP has been well controlled.    Review of Systems  Constitutional: Negative.   HENT: Negative.    Eyes: Negative.   Respiratory: Negative.    Cardiovascular: Negative.   Gastrointestinal: Negative.   Genitourinary: Negative.   Musculoskeletal: Negative.   Skin: Negative.   Neurological: Negative.   Psychiatric/Behavioral: Negative.        Objective:   Physical Exam Constitutional:      General: He is not in acute distress.    Appearance: Normal appearance. He is well-developed. He is not diaphoretic.  HENT:     Head: Normocephalic and atraumatic.     Right Ear: External ear normal.     Left Ear: External ear normal.     Nose: Nose normal.     Mouth/Throat:     Pharynx: No oropharyngeal exudate.  Eyes:     General: No scleral icterus.       Right eye: No discharge.        Left eye: No discharge.     Conjunctiva/sclera: Conjunctivae normal.     Pupils: Pupils are equal, round, and reactive to light.  Neck:     Thyroid: No thyromegaly.     Vascular: No JVD.     Trachea: No tracheal deviation.  Cardiovascular:     Rate and Rhythm: Normal rate and regular rhythm.     Heart sounds: Normal heart sounds. No murmur heard.   No friction rub. No gallop.  Pulmonary:     Effort: Pulmonary effort is normal. No respiratory distress.     Breath sounds: Normal breath sounds. No wheezing or rales.  Chest:     Chest wall: No tenderness.  Abdominal:     General: Bowel sounds are normal. There is no distension.      Palpations: Abdomen is soft. There is no mass.     Tenderness: There is no abdominal tenderness. There is no guarding or rebound.  Genitourinary:    Penis: Normal. No tenderness.      Testes: Normal.     Prostate: Normal.     Rectum: Normal. Guaiac result negative.  Musculoskeletal:        General: No tenderness. Normal range of motion.     Cervical back: Neck supple.  Lymphadenopathy:     Cervical: No cervical adenopathy.  Skin:    General: Skin is warm and dry.     Coloration: Skin is not pale.     Findings: No erythema or rash.  Neurological:     Mental Status: He is alert and oriented to person, place, and time.     Cranial Nerves: No cranial nerve deficit.     Motor: No abnormal muscle tone.     Coordination: Coordination normal.     Deep Tendon Reflexes: Reflexes are normal and symmetric. Reflexes normal.  Psychiatric:        Behavior: Behavior normal.  Thought Content: Thought content normal.        Judgment: Judgment normal.          Assessment & Plan:  Well exam. We discussed diet and exercise. Get fasting labs. Set up a colonoscopy. His HTN is now stable. We agreed he will take Flomax daily for the next month or so to encourage any remaining ureteral stones to pass.  Gershon Crane, MD

## 2020-10-04 DIAGNOSIS — J320 Chronic maxillary sinusitis: Secondary | ICD-10-CM | POA: Diagnosis not present

## 2020-10-04 DIAGNOSIS — J40 Bronchitis, not specified as acute or chronic: Secondary | ICD-10-CM | POA: Diagnosis not present

## 2020-12-01 DIAGNOSIS — M791 Myalgia, unspecified site: Secondary | ICD-10-CM | POA: Diagnosis not present

## 2020-12-01 DIAGNOSIS — U071 COVID-19: Secondary | ICD-10-CM | POA: Diagnosis not present

## 2020-12-01 DIAGNOSIS — R059 Cough, unspecified: Secondary | ICD-10-CM | POA: Diagnosis not present

## 2021-01-14 ENCOUNTER — Encounter: Payer: Self-pay | Admitting: Family Medicine

## 2021-03-30 ENCOUNTER — Encounter: Payer: Self-pay | Admitting: Gastroenterology

## 2021-04-15 ENCOUNTER — Ambulatory Visit (AMBULATORY_SURGERY_CENTER): Payer: BC Managed Care – PPO

## 2021-04-15 VITALS — Ht 71.0 in | Wt 180.0 lb

## 2021-04-15 DIAGNOSIS — Z1211 Encounter for screening for malignant neoplasm of colon: Secondary | ICD-10-CM

## 2021-04-15 MED ORDER — ONDANSETRON HCL 4 MG PO TABS
4.0000 mg | ORAL_TABLET | ORAL | 0 refills | Status: DC
Start: 2021-04-15 — End: 2021-05-07

## 2021-04-15 MED ORDER — NA SULFATE-K SULFATE-MG SULF 17.5-3.13-1.6 GM/177ML PO SOLN: 1.0000 | Freq: Once | ORAL | 0 refills | Status: AC

## 2021-04-15 NOTE — Progress Notes (Signed)
No egg or soy allergy known to patient  ?No issues known to pt with past sedation with any surgeries or procedures ?Patient denies ever being told they had issues or difficulty with intubation  ?No FH of Malignant Hyperthermia ?Pt is not on diet pills ?Pt is not on  home 02  ?Pt is not on blood thinners  ?Pt denies issues with constipation  ?No A fib or A flutter ? ?Pt to go to [email or web address] for coupon for suprep. ? ?  NO PA's for preps discussed with pt In PV today  ?Discussed with pt there will be an out-of-pocket cost for prep and that varies from $0 to 70 +  dollars - pt verbalized understanding  ? ?Due to the COVID-19 pandemic we are asking patients to follow certain guidelines in PV and the [facility]   ?Pt aware of COVID protocols and [facility] guidelines  ? ?PV completed over the phone. Pt verified name, DOB, address and insurance during PV today.  ?Pt mailed instruction packet with copy of consent form to read and not return, and instructions.  ?Pt encouraged to call with questions or issues.  ? ? ?

## 2021-04-28 ENCOUNTER — Encounter: Payer: Self-pay | Admitting: Gastroenterology

## 2021-05-07 ENCOUNTER — Ambulatory Visit (AMBULATORY_SURGERY_CENTER): Payer: BC Managed Care – PPO | Admitting: Gastroenterology

## 2021-05-07 ENCOUNTER — Encounter: Payer: Self-pay | Admitting: Gastroenterology

## 2021-05-07 VITALS — BP 113/64 | HR 58 | Temp 98.6°F | Resp 12 | Ht 71.0 in | Wt 180.0 lb

## 2021-05-07 DIAGNOSIS — Z1211 Encounter for screening for malignant neoplasm of colon: Secondary | ICD-10-CM | POA: Diagnosis not present

## 2021-05-07 MED ORDER — SODIUM CHLORIDE 0.9 % IV SOLN: 500.0000 mL | Freq: Once | INTRAVENOUS | Status: DC

## 2021-05-07 NOTE — Patient Instructions (Signed)
Handout provided on diverticulosis.   YOU HAD AN ENDOSCOPIC PROCEDURE TODAY AT THE [facility]:   Refer to the procedure report that was given to you for any specific questions about what was found during the examination.  If the procedure report does not answer your questions, please call your gastroenterologist to clarify.  If you requested that your care partner not be given the details of your procedure findings, then the procedure report has been included in a sealed envelope for you to review at your convenience later.  YOU SHOULD EXPECT: Some feelings of bloating in the abdomen. Passage of more gas than usual.  Walking can help get rid of the air that was put into your GI tract during the procedure and reduce the bloating. If you had a lower endoscopy (such as a colonoscopy or flexible sigmoidoscopy) you may notice spotting of blood in your stool or on the toilet paper. If you underwent a bowel prep for your procedure, you may not have a normal bowel movement for a few days.  Please Note:  You might notice some irritation and congestion in your nose or some drainage.  This is from the oxygen used during your procedure.  There is no need for concern and it should clear up in a day or so.  SYMPTOMS TO REPORT IMMEDIATELY:  Following lower endoscopy (colonoscopy or flexible sigmoidoscopy):  Excessive amounts of blood in the stool  Significant tenderness or worsening of abdominal pains  Swelling of the abdomen that is new, acute  Fever of 100F or higher  For urgent or emergent issues, a gastroenterologist can be reached at any hour by calling [phone]. Do not use MyChart messaging for urgent concerns.    DIET:  We do recommend a small meal at first, but then you may proceed to your regular diet.  Drink plenty of fluids but you should avoid alcoholic beverages for 24 hours.  ACTIVITY:  You should plan to take it easy for the rest of today and you should NOT DRIVE or use  heavy machinery until tomorrow (because of the sedation medicines used during the test).    FOLLOW UP: Our staff will call the number listed on your records 48-72 hours following your procedure to check on you and address any questions or concerns that you may have regarding the information given to you following your procedure. If we do not reach you, we will leave a message.  We will attempt to reach you two times.  During this call, we will ask if you have developed any symptoms of COVID 19. If you develop any symptoms (ie: fever, flu-like symptoms, shortness of breath, cough etc.) before then, please call [phone].  If you test positive for Covid 19 in the 2 weeks post procedure, please call and report this information to us.    If any biopsies were taken you will be contacted by phone or by letter within the next 1-3 weeks.  Please call us at [phone] if you have not heard about the biopsies in 3 weeks.    SIGNATURES/CONFIDENTIALITY: You and/or your care partner have signed paperwork which will be entered into your electronic medical record.  These signatures attest to the fact that that the information above on your After Visit Summary has been reviewed and is understood.  Full responsibility of the confidentiality of this discharge information lies with you and/or your care-partner.  

## 2021-05-07 NOTE — Op Note (Signed)
[place] ?Patient Name: Aaron Mahoney ?Procedure Date: 05/07/2021 1:15 PM ?MRN: [id number] ?Endoscopist: Milus Banister , MD ?Age: 51 ?Referring MD:  ?Date of Birth: April 04, 1970 ?Gender: Male ?Account #: [id number] ?Procedure:                Colonoscopy ?Indications:              Screening for colorectal malignant neoplasm ?Medicines:                Monitored Anesthesia Care ?Procedure:                Pre-Anesthesia Assessment: ?                          - Prior to the procedure, a History and Physical  ?                          was performed, and patient medications and  ?                          allergies were reviewed. The patient's tolerance of  ?                          previous anesthesia was also reviewed. The risks  ?                          and benefits of the procedure and the sedation  ?                          options and risks were discussed with the patient.  ?                          All questions were answered, and informed consent  ?                          was obtained. Prior Anticoagulants: The patient has  ?                          taken no previous anticoagulant or antiplatelet  ?                          agents. ASA Grade Assessment: II - A patient with  ?                          mild systemic disease. After reviewing the risks  ?                          and benefits, the patient was deemed in  ?                          satisfactory condition to undergo the procedure. ?                          After obtaining informed consent, the colonoscope  ?  was passed under direct vision. Throughout the  ?                          procedure, the patient's blood pressure, pulse, and  ?                          oxygen saturations were monitored continuously. The  ?                          Olympus CF-HQ190L Colonscope was introduced through  ?                          the anus and advanced to the the cecum, identified  ?                          by appendiceal  orifice and ileocecal valve. The  ?                          colonoscopy was performed without difficulty. The  ?                          patient tolerated the procedure well. The quality  ?                          of the bowel preparation was good. The ileocecal  ?                          valve, appendiceal orifice, and rectum were  ?                          photographed. ?Scope In: 1:21:36 PM ?Scope Out: 1:31:31 PM ?Scope Withdrawal Time: 0 hours 8 minutes 6 seconds  ?Total Procedure Duration: 0 hours 9 minutes 55 seconds  ?Findings:                 Multiple small and large-mouthed diverticula were  ?                          found in the left colon. ?                          The exam was otherwise without abnormality on  ?                          direct and retroflexion views. ?Complications:            No immediate complications. Estimated blood loss:  ?                          None. ?Estimated Blood Loss:     Estimated blood loss: none. ?Impression:               - Diverticulosis in the left colon. ?                          - The examination was otherwise normal on direct  ?  and retroflexion views. ?                          - No polyps or cancers. ?Recommendation:           - Patient has a contact number available for  ?                          emergencies. The signs and symptoms of potential  ?                          delayed complications were discussed with the  ?                          patient. Return to normal activities tomorrow.  ?                          Written discharge instructions were provided to the  ?                          patient. ?                          - Resume previous diet. ?                          - Continue present medications. ?                          - Repeat colonoscopy in 10 years for screening. ?Milus Banister, MD ?05/07/2021 1:33:28 PM ?This report has been signed electronically. ?

## 2021-05-07 NOTE — Progress Notes (Signed)
Pt's states no medical or surgical changes since previsit or office visit. 

## 2021-05-07 NOTE — Progress Notes (Signed)
HPI: ?This is a man at routine risk for CRC ? ? ?ROS: complete GI ROS as described in HPI, all other review negative. ? ?Constitutional:  No unintentional weight loss ? ? ?Past Medical History:  ?Diagnosis Date  ? Allergy   ? seasonal  ? Chronic kidney disease   ? stone history  ? Hypertension   ? ? ?History reviewed. No pertinent surgical history. ? ?Current Outpatient Medications  ?Medication Sig Dispense Refill  ? ibuprofen (ADVIL,MOTRIN) 600 MG tablet Take 1 tablet (600 mg total) by mouth every 6 (six) hours as needed. (Patient not taking: Reported on 04/15/2021) 30 tablet 0  ? lisinopril (ZESTRIL) 20 MG tablet Take 1 tablet (20 mg total) by mouth daily. 90 tablet 3  ? ondansetron (ZOFRAN) 4 MG tablet Take 1 tablet (4 mg total) by mouth as directed for 2 doses. Take one Zofran 4 mg tablet 30-60 minutes before each prep dose 2 tablet 0  ? tamsulosin (FLOMAX) 0.4 MG CAPS capsule Take 1 capsule (0.4 mg total) by mouth daily. 30 capsule 5  ? ?Current Facility-Administered Medications  ?Medication Dose Route Frequency Provider Last Rate Last Admin  ? 0.9 %  sodium chloride infusion  500 mL Intravenous Once Rachael Fee, MD      ? ? ?Allergies as of 05/07/2021  ? (No Known Allergies)  ? ? ?Family History  ?Problem Relation Age of Onset  ? Hypertension Father   ? Heart disease Father   ? Hypertension Brother   ? Colon cancer Neg Hx   ? Colon polyps Neg Hx   ? Esophageal cancer Neg Hx   ? Stomach cancer Neg Hx   ? Rectal cancer Neg Hx   ? ? ?Social History  ? ?Socioeconomic History  ? Marital status: Married  ?  Spouse name: Not on file  ? Number of children: Not on file  ? Years of education: Not on file  ? Highest education level: Not on file  ?Occupational History  ? Not on file  ?Tobacco Use  ? Smoking status: Never  ?  Passive exposure: Never  ? Smokeless tobacco: Never  ?Vaping Use  ? Vaping Use: Never used  ?Substance and Sexual Activity  ? Alcohol use: Never  ? Drug use: Never  ? Sexual activity: Yes  ?Other  Topics Concern  ? Not on file  ?Social History Narrative  ? Not on file  ? ?Social Determinants of Health  ? ?Financial Resource Strain: Not on file  ?Food Insecurity: Not on file  ?Transportation Needs: Not on file  ?Physical Activity: Not on file  ?Stress: Not on file  ?Social Connections: Not on file  ?Intimate Partner Violence: Not on file  ? ? ? ?Physical Exam: ?BP 136/77   Pulse 69   Temp 98.6 ?F (37 ?C) (Skin)   Ht 5\' 11"  (1.803 m)   Wt 180 lb (81.6 kg)   SpO2 99%   BMI 25.10 kg/m?  ?Constitutional: generally well-appearing ?Psychiatric: alert and oriented x3 ?Lungs: CTA bilaterally ?Heart: no MCR ? ?Assessment and plan: ?51 y.o. male with routine risk for CRC ? ?Screening colonoscoyp today ? ?Care is appropriate for the ambulatory setting. ? ?44, MD ?[facility] ?05/07/2021, 12:54 PM ? ? ? ?

## 2021-05-07 NOTE — Progress Notes (Signed)
To Pacu, VSS. Report to [email or web address] 

## 2021-05-11 ENCOUNTER — Telehealth: Payer: Self-pay

## 2021-05-11 NOTE — Telephone Encounter (Signed)
?  Follow up Call- ? ? ?  05/07/2021  ? 12:53 PM  ?Call back number  ?Post procedure Call Back phone  # [phone]  ?Permission to leave phone message Yes  ?  ? ?Patient questions: ? ?Do you have a fever, pain , or abdominal swelling? No. ?Pain Score  0 * ? ?Have you tolerated food without any problems? Yes.   ? ?Have you been able to return to your normal activities? Yes.   ? ?Do you have any questions about your discharge instructions: ?Diet   No. ?Medications  No. ?Follow up visit  No. ? ?Do you have questions or concerns about your Care? No. ? ?Actions: ?* If pain score is 4 or above: ?No action needed, pain <4. ? ? ?

## 2021-06-21 ENCOUNTER — Ambulatory Visit: Payer: BC Managed Care – PPO | Admitting: Family Medicine

## 2021-06-23 ENCOUNTER — Observation Stay (HOSPITAL_COMMUNITY): Payer: BC Managed Care – PPO

## 2021-06-23 ENCOUNTER — Observation Stay (HOSPITAL_BASED_OUTPATIENT_CLINIC_OR_DEPARTMENT_OTHER)
Admission: EM | Admit: 2021-06-23 | Discharge: 2021-06-23 | Disposition: A | Discharge status: Home / Self Care | Payer: BC Managed Care – PPO | Attending: Surgery | Admitting: Surgery

## 2021-06-23 ENCOUNTER — Observation Stay (HOSPITAL_COMMUNITY): Payer: BC Managed Care – PPO | Admitting: Certified Registered"

## 2021-06-23 ENCOUNTER — Emergency Department (HOSPITAL_BASED_OUTPATIENT_CLINIC_OR_DEPARTMENT_OTHER): Payer: BC Managed Care – PPO

## 2021-06-23 ENCOUNTER — Other Ambulatory Visit: Payer: Self-pay

## 2021-06-23 ENCOUNTER — Encounter (HOSPITAL_BASED_OUTPATIENT_CLINIC_OR_DEPARTMENT_OTHER): Payer: Self-pay | Admitting: Emergency Medicine

## 2021-06-23 ENCOUNTER — Encounter (HOSPITAL_COMMUNITY)
Admission: EM | Disposition: A | Discharge status: Home / Self Care | Payer: Self-pay | Source: Home / Self Care | Attending: Emergency Medicine

## 2021-06-23 DIAGNOSIS — N201 Calculus of ureter: Secondary | ICD-10-CM | POA: Diagnosis not present

## 2021-06-23 DIAGNOSIS — K8000 Calculus of gallbladder with acute cholecystitis without obstruction: Principal | ICD-10-CM

## 2021-06-23 DIAGNOSIS — I129 Hypertensive chronic kidney disease with stage 1 through stage 4 chronic kidney disease, or unspecified chronic kidney disease: Secondary | ICD-10-CM | POA: Diagnosis not present

## 2021-06-23 DIAGNOSIS — N189 Chronic kidney disease, unspecified: Secondary | ICD-10-CM | POA: Insufficient documentation

## 2021-06-23 DIAGNOSIS — M549 Dorsalgia, unspecified: Secondary | ICD-10-CM | POA: Diagnosis not present

## 2021-06-23 DIAGNOSIS — R600 Localized edema: Secondary | ICD-10-CM | POA: Diagnosis not present

## 2021-06-23 DIAGNOSIS — Z79899 Other long term (current) drug therapy: Secondary | ICD-10-CM | POA: Insufficient documentation

## 2021-06-23 DIAGNOSIS — K429 Umbilical hernia without obstruction or gangrene: Secondary | ICD-10-CM | POA: Diagnosis not present

## 2021-06-23 DIAGNOSIS — K8020 Calculus of gallbladder without cholecystitis without obstruction: Secondary | ICD-10-CM | POA: Diagnosis not present

## 2021-06-23 DIAGNOSIS — R079 Chest pain, unspecified: Secondary | ICD-10-CM | POA: Diagnosis not present

## 2021-06-23 DIAGNOSIS — M47814 Spondylosis without myelopathy or radiculopathy, thoracic region: Secondary | ICD-10-CM | POA: Diagnosis not present

## 2021-06-23 DIAGNOSIS — I10 Essential (primary) hypertension: Secondary | ICD-10-CM | POA: Diagnosis not present

## 2021-06-23 DIAGNOSIS — K4090 Unilateral inguinal hernia, without obstruction or gangrene, not specified as recurrent: Secondary | ICD-10-CM | POA: Diagnosis not present

## 2021-06-23 DIAGNOSIS — M419 Scoliosis, unspecified: Secondary | ICD-10-CM | POA: Diagnosis not present

## 2021-06-23 DIAGNOSIS — K810 Acute cholecystitis: Secondary | ICD-10-CM

## 2021-06-23 DIAGNOSIS — Z9049 Acquired absence of other specified parts of digestive tract: Secondary | ICD-10-CM | POA: Diagnosis not present

## 2021-06-23 HISTORY — PX: CHOLECYSTECTOMY: SHX55

## 2021-06-23 LAB — CBC WITH DIFFERENTIAL/PLATELET
Abs Immature Granulocytes: 0.06 10*3/uL (ref 0.00–0.07)
Basophils Absolute: 0.1 10*3/uL (ref 0.0–0.1)
Basophils Relative: 0 %
Eosinophils Absolute: 0.4 10*3/uL (ref 0.0–0.5)
Eosinophils Relative: 3 %
HCT: 41.7 % (ref 39.0–52.0)
Hemoglobin: 14.5 g/dL (ref 13.0–17.0)
Immature Granulocytes: 0 %
Lymphocytes Relative: 20 %
Lymphs Abs: 2.8 10*3/uL (ref 0.7–4.0)
MCH: 29.4 pg (ref 26.0–34.0)
MCHC: 34.8 g/dL (ref 30.0–36.0)
MCV: 84.4 fL (ref 80.0–100.0)
Monocytes Absolute: 1.3 10*3/uL — ABNORMAL HIGH (ref 0.1–1.0)
Monocytes Relative: 9 %
Neutro Abs: 9.5 10*3/uL — ABNORMAL HIGH (ref 1.7–7.7)
Neutrophils Relative %: 68 %
Platelets: 295 10*3/uL (ref 150–400)
RBC: 4.94 MIL/uL (ref 4.22–5.81)
RDW: 12.6 % (ref 11.5–15.5)
WBC: 14.1 10*3/uL — ABNORMAL HIGH (ref 4.0–10.5)
nRBC: 0 % (ref 0.0–0.2)

## 2021-06-23 LAB — COMPREHENSIVE METABOLIC PANEL
ALT: 41 U/L (ref 0–44)
AST: 34 U/L (ref 15–41)
Albumin: 4.1 g/dL (ref 3.5–5.0)
Alkaline Phosphatase: 102 U/L (ref 38–126)
Anion gap: 8 (ref 5–15)
BUN: 30 mg/dL — ABNORMAL HIGH (ref 6–20)
CO2: 23 mmol/L (ref 22–32)
Calcium: 9 mg/dL (ref 8.9–10.3)
Chloride: 106 mmol/L (ref 98–111)
Creatinine, Ser: 1.1 mg/dL (ref 0.61–1.24)
GFR, Estimated: >60 mL/min (ref >60)
Glucose, Bld: 130 mg/dL — ABNORMAL HIGH (ref 70–99)
Potassium: 3.3 mmol/L — ABNORMAL LOW (ref 3.5–5.1)
Sodium: 137 mmol/L (ref 135–145)
Total Bilirubin: 1 mg/dL (ref 0.3–1.2)
Total Protein: 6.8 g/dL (ref 6.5–8.1)

## 2021-06-23 LAB — URINALYSIS, ROUTINE W REFLEX MICROSCOPIC
Bilirubin Urine: NEGATIVE
Glucose, UA: NEGATIVE mg/dL
Ketones, ur: NEGATIVE mg/dL
Leukocytes,Ua: NEGATIVE
Nitrite: NEGATIVE
Protein, ur: NEGATIVE mg/dL
Specific Gravity, Urine: 1.015 (ref 1.005–1.030)
pH: 5.5 (ref 5.0–8.0)

## 2021-06-23 LAB — HIV ANTIBODY (ROUTINE TESTING W REFLEX): HIV Screen 4th Generation wRfx: NONREACTIVE

## 2021-06-23 LAB — URINALYSIS, MICROSCOPIC (REFLEX)

## 2021-06-23 LAB — LIPASE, BLOOD: Lipase: 30 U/L (ref 11–51)

## 2021-06-23 SURGERY — LAPAROSCOPIC CHOLECYSTECTOMY WITH INTRAOPERATIVE CHOLANGIOGRAM
Anesthesia: General

## 2021-06-23 MED ORDER — KETOROLAC TROMETHAMINE 30 MG/ML IJ SOLN
INTRAMUSCULAR | Status: AC
  Filled 2021-06-23: qty 1

## 2021-06-23 MED ORDER — ONDANSETRON HCL 4 MG/2ML IJ SOLN
INTRAMUSCULAR | Status: AC
  Filled 2021-06-23: qty 2

## 2021-06-23 MED ORDER — EPHEDRINE SULFATE-NACL 50-0.9 MG/10ML-% IV SOSY
PREFILLED_SYRINGE | INTRAVENOUS | Status: DC | PRN
  Administered 2021-06-23 (×2): 5 mg via INTRAVENOUS

## 2021-06-23 MED ORDER — HYDROMORPHONE HCL 1 MG/ML IJ SOLN
1.0000 mg | Freq: Once | INTRAMUSCULAR | Status: AC
  Administered 2021-06-23: 1 mg via INTRAVENOUS
  Filled 2021-06-23: qty 1

## 2021-06-23 MED ORDER — FENTANYL CITRATE PF 50 MCG/ML IJ SOSY
50.0000 ug | PREFILLED_SYRINGE | Freq: Once | INTRAMUSCULAR | Status: AC
  Administered 2021-06-23: 50 ug via INTRAVENOUS
  Filled 2021-06-23: qty 1

## 2021-06-23 MED ORDER — PROPOFOL 10 MG/ML IV BOLUS
INTRAVENOUS | Status: DC | PRN
  Administered 2021-06-23: 200 mg via INTRAVENOUS

## 2021-06-23 MED ORDER — SODIUM CHLORIDE 0.9 % IV SOLN
1.0000 g | Freq: Once | INTRAVENOUS | Status: AC
  Administered 2021-06-23: 1 g via INTRAVENOUS
  Filled 2021-06-23: qty 10

## 2021-06-23 MED ORDER — ONDANSETRON HCL 4 MG/2ML IJ SOLN: 4.0000 mg | Freq: Four times a day (QID) | INTRAMUSCULAR | Status: DC | PRN

## 2021-06-23 MED ORDER — MIDAZOLAM HCL 2 MG/2ML IJ SOLN
INTRAMUSCULAR | Status: AC
  Filled 2021-06-23: qty 2

## 2021-06-23 MED ORDER — FENTANYL CITRATE (PF) 100 MCG/2ML IJ SOLN
INTRAMUSCULAR | Status: AC
  Filled 2021-06-23: qty 2

## 2021-06-23 MED ORDER — SODIUM CHLORIDE 0.9 % IV SOLN
INTRAVENOUS | Status: DC | PRN
  Administered 2021-06-23: 10 mL

## 2021-06-23 MED ORDER — OXYCODONE HCL 5 MG PO TABS: 5.0000 mg | ORAL_TABLET | ORAL | Status: DC | PRN

## 2021-06-23 MED ORDER — ROCURONIUM BROMIDE 10 MG/ML (PF) SYRINGE
PREFILLED_SYRINGE | INTRAVENOUS | Status: AC
  Filled 2021-06-23: qty 10

## 2021-06-23 MED ORDER — AMISULPRIDE (ANTIEMETIC) 5 MG/2ML IV SOLN: 10.0000 mg | Freq: Once | INTRAVENOUS | Status: DC | PRN

## 2021-06-23 MED ORDER — HYDROMORPHONE HCL 1 MG/ML IJ SOLN
1.0000 mg | INTRAMUSCULAR | Status: DC | PRN
  Administered 2021-06-23: 1 mg via INTRAVENOUS
  Filled 2021-06-23: qty 1

## 2021-06-23 MED ORDER — FENTANYL CITRATE (PF) 250 MCG/5ML IJ SOLN
INTRAMUSCULAR | Status: DC | PRN
  Administered 2021-06-23: 100 ug via INTRAVENOUS
  Administered 2021-06-23 (×2): 50 ug via INTRAVENOUS

## 2021-06-23 MED ORDER — TAMSULOSIN HCL 0.4 MG PO CAPS: 0.4000 mg | ORAL_CAPSULE | Freq: Every day | ORAL | Status: DC

## 2021-06-23 MED ORDER — ENOXAPARIN SODIUM 40 MG/0.4ML IJ SOSY: 40.0000 mg | PREFILLED_SYRINGE | INTRAMUSCULAR | Status: DC

## 2021-06-23 MED ORDER — IOHEXOL 350 MG/ML SOLN
100.0000 mL | Freq: Once | INTRAVENOUS | Status: AC | PRN
Start: 2021-06-23 — End: 2021-06-23
  Administered 2021-06-23: 100 mL via INTRAVENOUS

## 2021-06-23 MED ORDER — LACTATED RINGERS IR SOLN
Status: DC | PRN
  Administered 2021-06-23: 1000 mL

## 2021-06-23 MED ORDER — ONDANSETRON HCL 4 MG/2ML IJ SOLN: 4.0000 mg | Freq: Once | INTRAMUSCULAR | Status: DC | PRN

## 2021-06-23 MED ORDER — ACETAMINOPHEN 325 MG PO TABS: 650.0000 mg | ORAL_TABLET | Freq: Four times a day (QID) | ORAL | Status: DC

## 2021-06-23 MED ORDER — ACETAMINOPHEN 325 MG PO TABS: 650.0000 mg | ORAL_TABLET | Freq: Four times a day (QID) | ORAL | Status: DC | PRN

## 2021-06-23 MED ORDER — METHOCARBAMOL 500 MG PO TABS: 500.0000 mg | ORAL_TABLET | Freq: Three times a day (TID) | ORAL | Status: DC | PRN

## 2021-06-23 MED ORDER — DIPHENHYDRAMINE HCL 50 MG/ML IJ SOLN: 25.0000 mg | Freq: Four times a day (QID) | INTRAMUSCULAR | Status: DC | PRN

## 2021-06-23 MED ORDER — ACETAMINOPHEN 10 MG/ML IV SOLN
1000.0000 mg | Freq: Once | INTRAVENOUS | Status: DC | PRN
Start: 2021-06-23 — End: 2021-06-23

## 2021-06-23 MED ORDER — DEXAMETHASONE SODIUM PHOSPHATE 10 MG/ML IJ SOLN
INTRAMUSCULAR | Status: AC
  Filled 2021-06-23: qty 1

## 2021-06-23 MED ORDER — ONDANSETRON 4 MG PO TBDP: 4.0000 mg | ORAL_TABLET | Freq: Four times a day (QID) | ORAL | Status: DC | PRN

## 2021-06-23 MED ORDER — ACETAMINOPHEN 325 MG PO TABS
650.0000 mg | ORAL_TABLET | Freq: Four times a day (QID) | ORAL | Status: AC | PRN
Start: 2021-06-23 — End: ?

## 2021-06-23 MED ORDER — OXYCODONE HCL 5 MG/5ML PO SOLN: 5.0000 mg | Freq: Once | ORAL | Status: DC | PRN

## 2021-06-23 MED ORDER — OXYCODONE HCL 5 MG PO TABS
5.0000 mg | ORAL_TABLET | Freq: Four times a day (QID) | ORAL | 0 refills | Status: AC | PRN
Start: 2021-06-23 — End: ?

## 2021-06-23 MED ORDER — ROCURONIUM BROMIDE 10 MG/ML (PF) SYRINGE
PREFILLED_SYRINGE | INTRAVENOUS | Status: DC | PRN
  Administered 2021-06-23: 70 mg via INTRAVENOUS

## 2021-06-23 MED ORDER — DEXAMETHASONE SODIUM PHOSPHATE 10 MG/ML IJ SOLN
INTRAMUSCULAR | Status: DC | PRN
  Administered 2021-06-23: 10 mg via INTRAVENOUS

## 2021-06-23 MED ORDER — PROPOFOL 10 MG/ML IV BOLUS
INTRAVENOUS | Status: AC
Start: 2021-06-23 — End: ?
  Filled 2021-06-23: qty 20

## 2021-06-23 MED ORDER — IBUPROFEN 600 MG PO TABS: 600.0000 mg | ORAL_TABLET | Freq: Four times a day (QID) | ORAL | 0 refills | Status: AC | PRN

## 2021-06-23 MED ORDER — LIDOCAINE HCL (PF) 2 % IJ SOLN
INTRAMUSCULAR | Status: AC
  Filled 2021-06-23: qty 5

## 2021-06-23 MED ORDER — OXYCODONE HCL 5 MG PO TABS: 5.0000 mg | ORAL_TABLET | Freq: Once | ORAL | Status: DC | PRN

## 2021-06-23 MED ORDER — METHOCARBAMOL 1000 MG/10ML IJ SOLN: 500.0000 mg | Freq: Three times a day (TID) | INTRAVENOUS | Status: DC | PRN

## 2021-06-23 MED ORDER — MIDAZOLAM HCL 5 MG/5ML IJ SOLN
INTRAMUSCULAR | Status: DC | PRN
  Administered 2021-06-23: 2 mg via INTRAVENOUS

## 2021-06-23 MED ORDER — LISINOPRIL 20 MG PO TABS: 20.0000 mg | ORAL_TABLET | Freq: Every day | ORAL | Status: DC

## 2021-06-23 MED ORDER — POTASSIUM CHLORIDE CRYS ER 20 MEQ PO TBCR
40.0000 meq | EXTENDED_RELEASE_TABLET | Freq: Once | ORAL | Status: AC
Start: 2021-06-23 — End: 2021-06-23
  Administered 2021-06-23: 40 meq via ORAL
  Filled 2021-06-23: qty 2

## 2021-06-23 MED ORDER — ONDANSETRON HCL 4 MG/2ML IJ SOLN
INTRAMUSCULAR | Status: DC | PRN
  Administered 2021-06-23: 4 mg via INTRAVENOUS

## 2021-06-23 MED ORDER — DIPHENHYDRAMINE HCL 25 MG PO CAPS: 25.0000 mg | ORAL_CAPSULE | Freq: Four times a day (QID) | ORAL | Status: DC | PRN

## 2021-06-23 MED ORDER — BUPIVACAINE-EPINEPHRINE 0.25% -1:200000 IJ SOLN
INTRAMUSCULAR | Status: DC | PRN
  Administered 2021-06-23: 16 mL

## 2021-06-23 MED ORDER — SODIUM CHLORIDE 0.9 % IV SOLN: 2.0000 g | INTRAVENOUS | Status: DC

## 2021-06-23 MED ORDER — BUPIVACAINE-EPINEPHRINE (PF) 0.25% -1:200000 IJ SOLN
INTRAMUSCULAR | Status: AC
  Filled 2021-06-23: qty 30

## 2021-06-23 MED ORDER — FENTANYL CITRATE PF 50 MCG/ML IJ SOSY: 25.0000 ug | PREFILLED_SYRINGE | INTRAMUSCULAR | Status: DC | PRN

## 2021-06-23 MED ORDER — SODIUM CHLORIDE 0.9 % IV SOLN
INTRAVENOUS | Status: DC
Start: 2021-06-23 — End: 2021-06-23

## 2021-06-23 MED ORDER — LIDOCAINE 2% (20 MG/ML) 5 ML SYRINGE
INTRAMUSCULAR | Status: DC | PRN
  Administered 2021-06-23: 100 mg via INTRAVENOUS

## 2021-06-23 MED ORDER — ACETAMINOPHEN 650 MG RE SUPP: 650.0000 mg | Freq: Four times a day (QID) | RECTAL | Status: DC | PRN

## 2021-06-23 MED ORDER — LACTATED RINGERS IV SOLN: INTRAVENOUS | Status: DC | PRN

## 2021-06-23 SURGICAL SUPPLY — 47 items
APPLIER CLIP ROT 10 11.4 M/L (STAPLE) ×2
BAG RETRIEVAL 10 (BASKET)
BENZOIN TINCTURE PRP APPL 2/3 (GAUZE/BANDAGES/DRESSINGS) ×2 IMPLANT
CABLE HIGH FREQUENCY MONO STRZ (ELECTRODE) ×2 IMPLANT
CHLORAPREP W/TINT 26 (MISCELLANEOUS) ×2 IMPLANT
CLIP APPLIE ROT 10 11.4 M/L (STAPLE) ×1 IMPLANT
COVER MAYO STAND XLG (MISCELLANEOUS) ×2 IMPLANT
COVER SURGICAL LIGHT HANDLE (MISCELLANEOUS) ×2 IMPLANT
DRAPE C-ARM 42X120 X-RAY (DRAPES) ×2 IMPLANT
DRSG IV TEGADERM 3.5X4.5 STRL (GAUZE/BANDAGES/DRESSINGS) ×1 IMPLANT
DRSG TEGADERM 2-3/8X2-3/4 SM (GAUZE/BANDAGES/DRESSINGS) ×6 IMPLANT
DRSG TEGADERM 4X4.75 (GAUZE/BANDAGES/DRESSINGS) ×2 IMPLANT
ELECT REM PT RETURN 15FT ADLT (MISCELLANEOUS) ×2 IMPLANT
GAUZE SPONGE 2X2 8PLY STRL LF (GAUZE/BANDAGES/DRESSINGS) IMPLANT
GLOVE BIO SURGEON STRL SZ7 (GLOVE) ×2 IMPLANT
GLOVE BIOGEL PI IND STRL 7.5 (GLOVE) ×1 IMPLANT
GLOVE BIOGEL PI INDICATOR 7.5 (GLOVE) ×1
GOWN STRL REUS W/ TWL LRG LVL3 (GOWN DISPOSABLE) ×1 IMPLANT
GOWN STRL REUS W/TWL LRG LVL3 (GOWN DISPOSABLE) ×1
IRRIG SUCT STRYKERFLOW 2 WTIP (MISCELLANEOUS) ×2
IRRIGATION SUCT STRKRFLW 2 WTP (MISCELLANEOUS) ×1 IMPLANT
KIT BASIN OR (CUSTOM PROCEDURE TRAY) ×2 IMPLANT
KIT TURNOVER KIT A (KITS) IMPLANT
NS IRRIG 1000ML POUR BTL (IV SOLUTION) ×2 IMPLANT
POUCH RETRIEVAL ECOSAC 10 (ENDOMECHANICALS) IMPLANT
POUCH RETRIEVAL ECOSAC 10MM (ENDOMECHANICALS) ×1
PROTECTOR NERVE ULNAR (MISCELLANEOUS) IMPLANT
SCISSORS LAP 5X35 DISP (ENDOMECHANICALS) ×2 IMPLANT
SET CHOLANGIOGRAPH MIX (MISCELLANEOUS) ×2 IMPLANT
SET TUBE SMOKE EVAC HIGH FLOW (TUBING) ×2 IMPLANT
SPIKE FLUID TRANSFER (MISCELLANEOUS) ×2 IMPLANT
SPONGE GAUZE 2X2 8PLY STRL LF (GAUZE/BANDAGES/DRESSINGS) ×1 IMPLANT
SPONGE GAUZE 2X2 STER 10/PKG (GAUZE/BANDAGES/DRESSINGS)
STRIP CLOSURE SKIN 1/2X4 (GAUZE/BANDAGES/DRESSINGS) ×2 IMPLANT
SUT MNCRL AB 4-0 PS2 18 (SUTURE) ×2 IMPLANT
SYR 20ML LL LF (SYRINGE) IMPLANT
SYS BAG RETRIEVAL 10MM (BASKET)
SYSTEM BAG RETRIEVAL 10MM (BASKET) IMPLANT
TAPE CLOTH 4X10 WHT NS (GAUZE/BANDAGES/DRESSINGS) IMPLANT
TAPE STRIPS DRAPE STRL (GAUZE/BANDAGES/DRESSINGS) ×1 IMPLANT
TOWEL OR 17X26 10 PK STRL BLUE (TOWEL DISPOSABLE) ×2 IMPLANT
TOWEL OR NON WOVEN STRL DISP B (DISPOSABLE) ×2 IMPLANT
TRAY LAPAROSCOPIC (CUSTOM PROCEDURE TRAY) ×2 IMPLANT
TROCAR 11X100 Z THREAD (TROCAR) ×2 IMPLANT
TROCAR BALLN 12MMX100 BLUNT (TROCAR) ×2 IMPLANT
TROCAR Z-THREAD OPTICAL 5X100M (TROCAR) ×4 IMPLANT
WATER STERILE IRR 1000ML POUR (IV SOLUTION) ×2 IMPLANT

## 2021-06-23 NOTE — Discharge Instructions (Addendum)
[facility] LAPAROSCOPIC SURGERY: POST OP INSTRUCTIONS Always review your discharge instruction sheet given to you by the facility where your surgery was performed. IF YOU HAVE DISABILITY OR FAMILY LEAVE FORMS, YOU MUST BRING THEM TO THE OFFICE FOR PROCESSING.   DO NOT GIVE THEM TO YOUR DOCTOR.  PAIN CONTROL  First take acetaminophen (Tylenol) AND/or ibuprofen (Advil) to control your pain after surgery.  Follow directions on package.  Taking acetaminophen (Tylenol) and/or ibuprofen (Advil) regularly after surgery will help to control your pain and lower the amount of prescription pain medication you may need.  You should not take more than 3,000 mg (3 grams) of acetaminophen (Tylenol) in 24 hours.  You should not take ibuprofen (Advil), aleve, motrin, naprosyn or other NSAIDS if you have a history of stomach ulcers or chronic kidney disease.  A prescription for pain medication may be given to you upon discharge.  Take your pain medication as prescribed, if you still have uncontrolled pain after taking acetaminophen (Tylenol) or ibuprofen (Advil). Use ice packs to help control pain. If you need a refill on your pain medication, please contact your pharmacy.  They will contact our office to request authorization. Prescriptions will not be filled after 5pm or on week-ends.  HOME MEDICATIONS Take your usually prescribed medications unless otherwise directed.  DIET You should follow a light diet the first few days after arrival home.  Be sure to include lots of fluids daily. Avoid fatty, fried foods.   CONSTIPATION It is common to experience some constipation after surgery and if you are taking pain medication.  Increasing fluid intake and taking a stool softener (such as Colace) will usually help or prevent this problem from occurring.  A mild laxative (Milk of Magnesia or Miralax) should be taken according to package instructions if there are no bowel movements after 48  hours.  WOUND/INCISION CARE Most patients will experience some swelling and bruising in the area of the incisions.  Ice packs will help.  Swelling and bruising can take several days to resolve.  Unless discharge instructions indicate otherwise, follow guidelines below  STERI-STRIPS - you may remove your outer bandages 48 hours after surgery, and you may shower at that time.  You have steri-strips (small skin tapes) in place directly over the incision.  These strips should be left on the skin for 7-10 days.   DERMABOND/SKIN GLUE - you may shower in 24 hours.  The glue will flake off over the next 2-3 weeks. Any sutures or staples will be removed at the office during your follow-up visit.  ACTIVITIES You may resume regular (light) daily activities beginning the next day--such as daily self-care, walking, climbing stairs--gradually increasing activities as tolerated.  You may have sexual intercourse when it is comfortable.  Refrain from any heavy lifting or straining until approved by your doctor. You may drive when you are no longer taking prescription pain medication, you can comfortably wear a seatbelt, and you can safely maneuver your car and apply brakes.  FOLLOW-UP You should see your doctor in the office for a follow-up appointment approximately 2-3 weeks after your surgery.  You should have been given your post-op/follow-up appointment when your surgery was scheduled.  If you did not receive a post-op/follow-up appointment, make sure that you call for this appointment within a day or two after you arrive home to insure a convenient appointment time.   WHEN TO CALL YOUR DOCTOR: Fever over 101.0 Inability to urinate Continued bleeding from incision.   Increased pain, redness, or drainage from the incision. Increasing abdominal pain  The clinic staff is available to answer your questions during regular business hours.  Please don't hesitate to call and ask to speak to one of the nurses for  clinical concerns.  If you have a medical emergency, go to the nearest emergency room or call 911.  A surgeon from [facility] is always on call at the hospital. [address], [address], [place], [place]  [address] ? [address] [address], [place], [place]   [address] [phone] ? [phone] ? FAX [phone] Web site: [email or web address]      Managing Your Pain After Surgery Without Opioids    Thank you for participating in our program to help patients manage their pain after surgery without opioids. This is part of our effort to provide you with the best care possible, without exposing you or your family to the risk that opioids pose.  What pain can I expect after surgery? You can expect to have some pain after surgery. This is normal. The pain is typically worse the day after surgery, and quickly begins to get better. Many studies have found that many patients are able to manage their pain after surgery with Over-the-Counter (OTC) medications such as Tylenol and Motrin. If you have a condition that does not allow you to take Tylenol or Motrin, notify your surgical team.  How will I manage my pain? The best strategy for controlling your pain after surgery is around the clock pain control with Tylenol (acetaminophen) and Motrin (ibuprofen or Advil). Alternating these medications with each other allows you to maximize your pain control. In addition to Tylenol and Motrin, you can use heating pads or ice packs on your incisions to help reduce your pain.  How will I alternate your regular strength over-the-counter pain medication? You will take a dose of pain medication every three hours. Start by taking 650 mg of Tylenol (2 pills of 325 mg) 3 hours later take 600 mg of Motrin (3 pills of 200 mg) 3 hours after taking the Motrin take 650 mg of Tylenol 3 hours after that take 600 mg of Motrin.   - 1 -  See example - if your first dose of Tylenol is at 12:00  PM   12:00 PM Tylenol 650 mg (2 pills of 325 mg)  3:00 PM Motrin 600 mg (3 pills of 200 mg)  6:00 PM Tylenol 650 mg (2 pills of 325 mg)  9:00 PM Motrin 600 mg (3 pills of 200 mg)  Continue alternating every 3 hours   We recommend that you follow this schedule around-the-clock for at least 3 days after surgery, or until you feel that it is no longer needed. Use the table on the last page of this handout to keep track of the medications you are taking. Important: Do not take more than 3000mg of Tylenol or 3200mg of Motrin in a 24-hour period. Do not take ibuprofen/Motrin if you have a history of bleeding stomach ulcers, severe kidney disease, &/or actively taking a blood thinner  What if I still have pain? If you have pain that is not controlled with the over-the-counter pain medications (Tylenol and Motrin or Advil) you might have what we call "breakthrough" pain. You will receive a prescription for a small amount of an opioid pain medication such as Oxycodone, Tramadol, or Tylenol with Codeine. Use these opioid pills in the first 24 hours after surgery if you have breakthrough pain. Do   not take more than 1 pill every 4-6 hours.  If you still have uncontrolled pain after using all opioid pills, don't hesitate to call our staff using the number provided. We will help make sure you are managing your pain in the best way possible, and if necessary, we can provide a prescription for additional pain medication.   Day 1    Time  Name of Medication Number of pills taken  Amount of Acetaminophen  Pain Level   Comments  AM PM       AM PM       AM PM       AM PM       AM PM       AM PM       AM PM       AM PM       Total Daily amount of Acetaminophen Do not take more than  3,000 mg per day      Day 2    Time  Name of Medication Number of pills taken  Amount of Acetaminophen  Pain Level   Comments  AM PM       AM PM       AM PM       AM PM       AM PM       AM PM       AM  PM       AM PM       Total Daily amount of Acetaminophen Do not take more than  3,000 mg per day      Day 3    Time  Name of Medication Number of pills taken  Amount of Acetaminophen  Pain Level   Comments  AM PM       AM PM       AM PM       AM PM          AM PM       AM PM       AM PM       AM PM       Total Daily amount of Acetaminophen Do not take more than  3,000 mg per day      Day 4    Time  Name of Medication Number of pills taken  Amount of Acetaminophen  Pain Level   Comments  AM PM       AM PM       AM PM       AM PM       AM PM       AM PM       AM PM       AM PM       Total Daily amount of Acetaminophen Do not take more than  3,000 mg per day      Day 5    Time  Name of Medication Number of pills taken  Amount of Acetaminophen  Pain Level   Comments  AM PM       AM PM       AM PM       AM PM       AM PM       AM PM       AM PM       AM PM       Total Daily amount of Acetaminophen Do not take more than    3,000 mg per day       Day 6    Time  Name of Medication Number of pills taken  Amount of Acetaminophen  Pain Level  Comments  AM PM       AM PM       AM PM       AM PM       AM PM       AM PM       AM PM       AM PM       Total Daily amount of Acetaminophen Do not take more than  3,000 mg per day      Day 7    Time  Name of Medication Number of pills taken  Amount of Acetaminophen  Pain Level   Comments  AM PM       AM PM       AM PM       AM PM       AM PM       AM PM       AM PM       AM PM       Total Daily amount of Acetaminophen Do not take more than  3,000 mg per day        For additional information about how and where to safely dispose of unused opioid medications - https://www.morepowerfulnc.org  Disclaimer: This document contains information and/or instructional materials adapted from [facility] for the typical patient with your condition. It does not replace medical advice  from your health care provider because your experience may differ from that of the typical patient. Talk to your health care provider if you have any questions about this document, your condition or your treatment plan. Adapted from [facility]  

## 2021-06-23 NOTE — Op Note (Signed)
Laparoscopic Cholecystectomy with IOC Procedure Note  Indications: This patient presents with acute cholecystitis and will undergo laparoscopic cholecystectomy.  Pre-operative Diagnosis: Calculus of gallbladder with acute cholecystitis, without mention of obstruction  Post-operative Diagnosis: Same  Surgeon: Wynona Luna   Assistants: Trixie Deis, PA-C  Anesthesia: General endotracheal anesthesia  ASA Class: 1  Procedure Details  The patient was seen again in the Holding Room. The risks, benefits, complications, treatment options, and expected outcomes were discussed with the patient. The possibilities of reaction to medication, pulmonary aspiration, perforation of viscus, bleeding, recurrent infection, finding a normal gallbladder, the need for additional procedures, failure to diagnose a condition, the possible need to convert to an open procedure, and creating a complication requiring transfusion or operation were discussed with the patient. The likelihood of improving the patient's symptoms with return to their baseline status is good.  The patient and/or family concurred with the proposed plan, giving informed consent. The site of surgery properly noted. The patient was taken to Operating Room, identified as Aaron Mahoney and the procedure verified as Laparoscopic Cholecystectomy with Intraoperative Cholangiogram. A Time Out was held and the above information confirmed.  Prior to the induction of general anesthesia, antibiotic prophylaxis was administered. General endotracheal anesthesia was then administered and tolerated well. After the induction, the abdomen was prepped with Chloraprep and draped in the sterile fashion. The patient was positioned in the supine position.  Local anesthetic agent was injected into the skin near the umbilicus and an incision made. We dissected down to the abdominal fascia with blunt dissection.  The fascia was incised vertically and we entered the  peritoneal cavity bluntly.  A pursestring suture of 0-Vicryl was placed around the fascial opening.  The Hasson cannula was inserted and secured with the stay suture.  Pneumoperitoneum was then created with CO2 and tolerated well without any adverse changes in the patient's vital signs. An 11-mm port was placed in the subxiphoid position.  Two 5-mm ports were placed in the right upper quadrant. All skin incisions were infiltrated with a local anesthetic agent before making the incision and placing the trocars.   We positioned the patient in reverse Trendelenburg, tilted slightly to the patient's left.  The omentum is densely adherent to the inflamed gallbladder.  The gallbladder was identified, the fundus grasped and retracted cephalad.   Adhesions were lysed bluntly and with the electrocautery where indicated, taking care not to injure any adjacent organs or viscus. The infundibulum was grasped and retracted laterally, exposing the peritoneum overlying the triangle of Calot. This was then divided and exposed in a blunt fashion. A critical view of the cystic duct and cystic artery was obtained.  The cystic duct was clearly identified and bluntly dissected circumferentially. The cystic duct was ligated with a clip distally.   An incision was made in the cystic duct and the [facility] cholangiogram catheter introduced. The catheter was secured using a clip. A cholangiogram was then obtained which showed good visualization of the distal and proximal biliary tree with no sign of filling defects or obstruction.  Contrast flowed easily into the duodenum. The catheter was then removed.   The cystic duct was then ligated with clips and divided. The cystic artery was identified, dissected free, ligated with clips and divided as well.   The gallbladder was dissected from the liver bed in retrograde fashion with the electrocautery.  Some bile was spilled but no stones fell out. The gallbladder was removed and placed in an Eco  sac. The liver bed was irrigated and inspected. Hemostasis was achieved with the electrocautery. Copious irrigation was utilized and was repeatedly aspirated until clear.  The gallbladder and Eco sac were then removed through the umbilical port site.  The pursestring suture was used to close the umbilical fascia.    We again inspected the right upper quadrant for hemostasis.  Pneumoperitoneum was released as we removed the trocars.  4-0 Monocryl was used to close the skin.   Benzoin, steri-strips, and clean dressings were applied. The patient was then extubated and brought to the recovery room in stable condition. Instrument, sponge, and needle counts were correct at closure and at the conclusion of the case.   Findings: Cholecystitis with Cholelithiasis  Estimated Blood Loss: less than 50 mL         Drains: none         Specimens: Gallbladder           Complications: None; patient tolerated the procedure well.         Disposition: PACU - hemodynamically stable.         Condition: stable  Aaron Mahoney. Aaron Skains, MD, [facility]  General Surgery   06/23/2021 1:24 PM

## 2021-06-23 NOTE — ED Notes (Addendum)
First contact with Patient. Patient resting quietly at this time. Will continue to monitor.   0923: Report given to Brayton Caves RN at [facility] room 1342-1. Patient states pain is controlled at this time. Report given to Alex at [facility] as well. Called pts wife per Patient request to inform her of patient status.   WIFE WOULD LIKE TO PLEASE be informed of any changes in condition or surgery updates.  Candice [phone]

## 2021-06-23 NOTE — Anesthesia Procedure Notes (Signed)
Procedure Name: Intubation Date/Time: 06/23/2021 12:27 PM  Performed by: Lyndon Chenoweth D, CRNAPre-anesthesia Checklist: Patient identified, Emergency Drugs available, Suction available and Patient being monitored Patient Re-evaluated:Patient Re-evaluated prior to induction Oxygen Delivery Method: Circle system utilized Preoxygenation: Pre-oxygenation with 100% oxygen Induction Type: IV induction Ventilation: Mask ventilation without difficulty Laryngoscope Size: Mac and 4 Grade View: Grade II Tube type: Oral Tube size: 7.5 mm Number of attempts: 1 Airway Equipment and Method: Stylet and Oral airway Placement Confirmation: ETT inserted through vocal cords under direct vision, positive ETCO2 and breath sounds checked- equal and bilateral Tube secured with: Tape Dental Injury: Teeth and Oropharynx as per pre-operative assessment

## 2021-06-23 NOTE — Anesthesia Preprocedure Evaluation (Addendum)
Anesthesia Evaluation  Patient identified by MRN, date of birth, ID band Patient awake    Reviewed: Allergy & Precautions, NPO status , Patient's Chart, lab work & pertinent test results  History of Anesthesia Complications Negative for: history of anesthetic complications  Airway Mallampati: III  TM Distance: >3 FB Neck ROM: Full    Dental  (+) Dental Advisory Given, Teeth Intact   Pulmonary neg pulmonary ROS,    Pulmonary exam normal        Cardiovascular hypertension, Normal cardiovascular exam     Neuro/Psych negative neurological ROS     GI/Hepatic Neg liver ROS, Acute cholecystitis   Endo/Other  negative endocrine ROS  Renal/GU negative Renal ROS  negative genitourinary   Musculoskeletal negative musculoskeletal ROS (+)   Abdominal   Peds  Hematology negative hematology ROS (+)   Anesthesia Other Findings   Reproductive/Obstetrics                            Anesthesia Physical Anesthesia Plan  ASA: 2 and emergent  Anesthesia Plan: General   Post-op Pain Management: Tylenol PO (pre-op)* and Toradol IV (intra-op)*   Induction: Intravenous  PONV Risk Score and Plan: 3 and Ondansetron, Dexamethasone, Treatment may vary due to age or medical condition and Midazolam  Airway Management Planned: Oral ETT  Additional Equipment: None  Intra-op Plan:   Post-operative Plan: Extubation in OR  Informed Consent: I have reviewed the patients History and Physical, chart, labs and discussed the procedure including the risks, benefits and alternatives for the proposed anesthesia with the patient or authorized representative who has indicated his/her understanding and acceptance.     Dental advisory given  Plan Discussed with:   Anesthesia Plan Comments:         Anesthesia Quick Evaluation

## 2021-06-23 NOTE — H&P (Signed)
Aaron Mahoney is an 51 y.o. male.   Chief Complaint: Back pain/ RUQ abdominal pain HPI: This is a 51 year old male with a history of HTN/ nephrolithiasis who presented to [facility] ED with back pain that radiated around to his upper abdomen.  Associated with some vomiting.  He was evaluated in the ED and was found to have gallbladder wall thickening and several gallstones.  LFT's WNL.  He is being transferred for management.  Past Medical History:  Diagnosis Date   Allergy    seasonal   Chronic kidney disease    stone history   Hypertension     History reviewed. No pertinent surgical history.  Family History  Problem Relation Age of Onset   Hypertension Father    Heart disease Father    Hypertension Brother    Colon cancer Neg Hx    Colon polyps Neg Hx    Esophageal cancer Neg Hx    Stomach cancer Neg Hx    Rectal cancer Neg Hx    Social History:  reports that he has never smoked. He has never been exposed to tobacco smoke. He has never used smokeless tobacco. He reports that he does not drink alcohol and does not use drugs.  Allergies: No Known Allergies  Prior to Admission medications   Medication Sig Start Date End Date Taking? Authorizing Provider  ibuprofen (ADVIL,MOTRIN) 600 MG tablet Take 1 tablet (600 mg total) by mouth every 6 (six) hours as needed. Patient not taking: Reported on 04/15/2021 03/01/17   Nira Conn, MD  lisinopril (ZESTRIL) 20 MG tablet Take 1 tablet (20 mg total) by mouth daily. 07/16/20   Nelwyn Salisbury, MD  tamsulosin (FLOMAX) 0.4 MG CAPS capsule Take 1 capsule (0.4 mg total) by mouth daily. 09/01/20   Nelwyn Salisbury, MD      Results for orders placed or performed during the hospital encounter of 06/23/21 (from the past 48 hour(s))  CBC with Differential     Status: Abnormal   Collection Time: 06/23/21 12:59 AM  Result Value Ref Range   WBC 14.1 (H) 4.0 - 10.5 K/uL   RBC 4.94 4.22 - 5.81 MIL/uL   Hemoglobin 14.5 13.0 - 17.0 g/dL   HCT 16.6  06.3 - 01.6 %   MCV 84.4 80.0 - 100.0 fL   MCH 29.4 26.0 - 34.0 pg   MCHC 34.8 30.0 - 36.0 g/dL   RDW 01.0 93.2 - 35.5 %   Platelets 295 150 - 400 K/uL   nRBC 0.0 0.0 - 0.2 %   Neutrophils Relative % 68 %   Neutro Abs 9.5 (H) 1.7 - 7.7 K/uL   Lymphocytes Relative 20 %   Lymphs Abs 2.8 0.7 - 4.0 K/uL   Monocytes Relative 9 %   Monocytes Absolute 1.3 (H) 0.1 - 1.0 K/uL   Eosinophils Relative 3 %   Eosinophils Absolute 0.4 0.0 - 0.5 K/uL   Basophils Relative 0 %   Basophils Absolute 0.1 0.0 - 0.1 K/uL   Immature Granulocytes 0 %   Abs Immature Granulocytes 0.06 0.00 - 0.07 K/uL    Comment: Performed at [facility], [address] [place], [place] [address]  Comprehensive metabolic panel     Status: Abnormal   Collection Time: 06/23/21 12:59 AM  Result Value Ref Range   Sodium 137 135 - 145 mmol/L   Potassium 3.3 (L) 3.5 - 5.1 mmol/L   Chloride 106 98 - 111 mmol/L  CO2 23 22 - 32 mmol/L   Glucose, Bld 130 (H) 70 - 99 mg/dL    Comment: Glucose reference range applies only to samples taken after fasting for at least 8 hours.   BUN 30 (H) 6 - 20 mg/dL   Creatinine, Ser 1.10 0.61 - 1.24 mg/dL   Calcium 9.0 8.9 - 10.3 mg/dL   Total Protein 6.8 6.5 - 8.1 g/dL   Albumin 4.1 3.5 - 5.0 g/dL   AST 34 15 - 41 U/L   ALT 41 0 - 44 U/L   Alkaline Phosphatase 102 38 - 126 U/L   Total Bilirubin 1.0 0.3 - 1.2 mg/dL   GFR, Estimated >60 >60 mL/min    Comment: (NOTE) Calculated using the CKD-EPI Creatinine Equation (2021)    Anion gap 8 5 - 15    Comment: Performed at [facility], [address] [place], [place] [address]  Lipase, blood     Status: None   Collection Time: 06/23/21 12:59 AM  Result Value Ref Range   Lipase 30 11 - 51 U/L    Comment: Performed at [facility], [address] [place], [place] [address]  Urinalysis, Routine w reflex microscopic     Status: Abnormal   Collection Time: 06/23/21  2:54 AM  Result Value Ref Range   Color,  Urine YELLOW YELLOW   APPearance CLEAR CLEAR   Specific Gravity, Urine 1.015 1.005 - 1.030   pH 5.5 5.0 - 8.0   Glucose, UA NEGATIVE NEGATIVE mg/dL   Hgb urine dipstick MODERATE (A) NEGATIVE   Bilirubin Urine NEGATIVE NEGATIVE   Ketones, ur NEGATIVE NEGATIVE mg/dL   Protein, ur NEGATIVE NEGATIVE mg/dL   Nitrite NEGATIVE NEGATIVE   Leukocytes,Ua NEGATIVE NEGATIVE    Comment: Performed at [facility], [address] [place], [place] [address]  Urinalysis, Microscopic (reflex)     Status: Abnormal   Collection Time: 06/23/21  2:54 AM  Result Value Ref Range   RBC / HPF 6-10 0 - 5 RBC/hpf   WBC, UA 0-5 0 - 5 WBC/hpf   Bacteria, UA FEW (A) NONE SEEN   Squamous Epithelial / LPF 0-5 0 - 5   Mucus PRESENT     Comment: Performed at [facility], [address] [place], [place] [address]   CT Angio Chest/Abd/Pel for Dissection W and/or W/WO  Result Date: 06/23/2021 CLINICAL DATA:  Chest and back pain. Evaluate for acute aortic dissection. EXAM: CT ANGIOGRAPHY CHEST, ABDOMEN AND PELVIS TECHNIQUE: Initial chest CT without contrast was performed. Multidetector CT imaging through the chest, abdomen and pelvis was performed using the standard protocol during bolus administration of intravenous contrast. Multiplanar reconstructed images and MIPs were obtained and reviewed to evaluate the vascular anatomy. RADIATION DOSE REDUCTION: This exam was performed according to the departmental dose-optimization program which includes automated exposure control, adjustment of the mA and/or kV according to patient size and/or use of iterative reconstruction technique. CONTRAST:  157mL OMNIPAQUE IOHEXOL 350 MG/ML SOLN COMPARISON:  CT abdomen and pelvis no contrast 08/15/2020 FINDINGS: CTA CHEST FINDINGS Cardiovascular: The cardiac size is normal. There is no pericardial effusion. There is no visible coronary artery calcification. The aorta and great vessels opacify homogeneously without aneurysm,  visible plaques or dissection. The pulmonary arteries are normal caliber and centrally clear and the pulmonary veins are decompressed. Mediastinum/Nodes: No enlarged mediastinal, hilar, or axillary lymph nodes. Thyroid gland, trachea, and esophagus demonstrate no significant findings.  Lungs/Pleura: Mild asymmetric elevation right hemidiaphragm is again noted. There is no pleural effusion, thickening or pneumothorax. The central airways and lungs clear. Musculoskeletal: There are mild degenerative changes of the thoracic spine. No acute or significant osseous findings. Review of the MIP images confirms the above findings. CTA ABDOMEN AND PELVIS FINDINGS VASCULAR Aorta: Normal caliber aorta without aneurysm, dissection, vasculitis or significant stenosis. Celiac: Patent without evidence of aneurysm, dissection, vasculitis or significant stenosis. SMA: Patent without evidence of aneurysm, dissection, vasculitis or significant stenosis. Renals: Both renal arteries are patent without evidence of aneurysm, dissection, vasculitis, fibromuscular dysplasia or significant stenosis. IMA: Patent without evidence of aneurysm, dissection, vasculitis or significant stenosis. Inflow: Patent without evidence of aneurysm, dissection, vasculitis or significant stenosis. Veins: No obvious venous abnormality within the limitations of this arterial phase study. Review of the MIP images confirms the above findings. NON-VASCULAR Hepatobiliary: The liver is 19 cm length with mild-to-moderate steatosis. No mass enhancement is seen. Gallbladder has slightly dilated since the prior study at 9 cm in length with wall thickening and pericholecystic edema, minimal pericholecystic fluid. There are several gallstones largest is 1 cm and at least 1 contains air consistent with a cholesterol stone. The findings are consistent with acute cholecystitis. Pancreas: No focal abnormality. Spleen: No focal abnormality or splenomegaly. Adrenals/Urinary Tract:  There is no adrenal mass. The renal cortex enhances symmetrically. There is no hydronephrosis either side. Again noted is a 6 mm proximal left ureteral stone which was previously at the UPJ at the level of L2-3 common now is in the proximal ureter at the level of L3-4. Both ureters are otherwise clear. No intrarenal stones are noted on either side. The bladder is unremarkable for the degree of distention. Stomach/Bowel: No dilatation or wall thickening including the appendix. There is mild fecal stasis ascending and transverse colon. There is diffuse diverticulosis without evidence of diverticulitis. Lymphatic: There is no appreciable adenopathy. Reproductive: Prostate is unremarkable. Other: There are small umbilical and inguinal fat hernias. There is no incarcerated hernia. There is no free air, free hemorrhage or free fluid. Musculoskeletal: There is mild dextroscoliosis and degenerative changes of the spine. Review of the MIP images confirms the above findings. IMPRESSION: 1. Negative CTA study. 2. Cholelithiasis and probable acute cholecystitis. No biliary dilatation. 3. 6 mm proximal left ureteral stone at the level of L3-4, having moved inferiorly only 1 vertebral body level since 08/15/2020. Currently there is no hydronephrosis and both kidneys enhance symmetrically. 4. Hepatic steatosis. 5. Diffuse diverticulosis without evidence of diverticulitis. 6. Small umbilical and inguinal fat hernias. 7. Degenerative changes of the spine, with slight lumbar dextroscoliosis. Electronically Signed   By: Telford Nab M.D.   On: 06/23/2021 02:30    Review of Systems  HENT:  Negative for ear discharge, ear pain, hearing loss and tinnitus.   Eyes:  Negative for photophobia and pain.  Respiratory:  Negative for cough and shortness of breath.   Cardiovascular:  Negative for chest pain.  Gastrointestinal:  Positive for abdominal pain and vomiting. Negative for nausea.  Genitourinary:  Positive for flank pain.  Negative for dysuria, frequency and urgency.  Musculoskeletal:  Positive for back pain. Negative for myalgias and neck pain.  Neurological:  Negative for dizziness and headaches.  Hematological:  Does not bruise/bleed easily.  Psychiatric/Behavioral:  The patient is not nervous/anxious.     Blood pressure 121/77, pulse (!) 52, temperature 98.1 F (36.7 C), temperature source Oral, resp. rate 15, height 5\' 11"  (1.803 m), weight 86.2 kg,  SpO2 97 %. Physical Exam  Constitutional:  WDWN in NAD, conversant, no obvious deformities; lying in bed comfortably Eyes:  Pupils equal, round; sclera anicteric; moist conjunctiva; no lid lag HENT:  Oral mucosa moist; good dentition  Neck:  No masses palpated, trachea midline; no thyromegaly Lungs:  CTA bilaterally; normal respiratory effort CV:  Regular rate and rhythm; no murmurs; extremities well-perfused with no edema Abd:  +bowel sounds, soft, mild RUQ discomfort; no palpable organomegaly; no palpable hernias Musc:  Pain in between scapula; mid-back; no apparent clubbing or cyanosis in extremities Lymphatic:  No palpable cervical or axillary lymphadenopathy Skin:  Warm, dry; no sign of jaundice Psychiatric - alert and oriented x 4; calm mood and affect  Assessment/Plan Acute calculous cholecystitis  Transfer to [facility] NPO IV antibiotics Plan laparoscopic cholecystectomy with intraoperative cholangiogram later today.  The surgical procedure has been discussed with the patient.  Potential risks, benefits, alternative treatments, and expected outcomes have been explained.  All of the patient's questions at this time have been answered.  The likelihood of reaching the patient's treatment goal is good.  The patient understand the proposed surgical procedure and wishes to proceed.   Maia Petties, MD 06/23/2021, 8:19 AM

## 2021-06-23 NOTE — Progress Notes (Signed)
Transition of Care [facility]) Screening Note  Patient Details  Name: Aaron Mahoney Date of Birth: Oct 19, 1970  Transition of Care [facility]) CM/SW Contact:    Ewing Schlein, LCSW Phone Number: 06/23/2021, 9:32 AM  Transition of Care Department [facility]) has reviewed patient and no TOC needs have been identified at this time. We will continue to monitor patient advancement through interdisciplinary progression rounds. If new patient transition needs arise, please place a TOC consult.

## 2021-06-23 NOTE — ED Provider Notes (Signed)
[facility] EMERGENCY DEPARTMENT  Provider Note  CSN: [id number] Arrival date & time: 06/23/21 0045  History Chief Complaint  Patient presents with   Back Pain    Aaron Mahoney is a 51 y.o. male with history of HTN and kidney stones reports some upper/subscapular back pain yesterday, improved but returned earlier this evening and now radiates around to upper abdomen. He has had several instances of vomiting but does not feel nauseated. He has had a 'weird cough' recently that sounds like silent reflux/laryngospasm. He does not think this pain is similar to prior kidney stones. Denies any bowel or bladder problems. No recent changes in medications. Does not drink much alcohol.    Home Medications Prior to Admission medications   Medication Sig Start Date End Date Taking? Authorizing Provider  ibuprofen (ADVIL,MOTRIN) 600 MG tablet Take 1 tablet (600 mg total) by mouth every 6 (six) hours as needed. Patient not taking: Reported on 04/15/2021 03/01/17   Nira Conn, MD  lisinopril (ZESTRIL) 20 MG tablet Take 1 tablet (20 mg total) by mouth daily. 07/16/20   Nelwyn Salisbury, MD  tamsulosin (FLOMAX) 0.4 MG CAPS capsule Take 1 capsule (0.4 mg total) by mouth daily. 09/01/20   Nelwyn Salisbury, MD     Allergies    Patient has no known allergies.   Review of Systems   Review of Systems Please see HPI for pertinent positives and negatives  Physical Exam BP 126/83 (BP Location: Right Arm)   Pulse 60   Temp 98.1 F (36.7 C) (Oral)   Resp 12   Ht 5\' 11"  (1.803 m)   Wt 86.2 kg   SpO2 95%   BMI 26.50 kg/m   Physical Exam Vitals and nursing note reviewed.  Constitutional:      Appearance: Normal appearance.     Comments: uncomfortable  HENT:     Head: Normocephalic and atraumatic.     Nose: Nose normal.     Mouth/Throat:     Mouth: Mucous membranes are moist.  Eyes:     Extraocular Movements: Extraocular movements intact.     Conjunctiva/sclera: Conjunctivae  normal.  Cardiovascular:     Rate and Rhythm: Normal rate.  Pulmonary:     Effort: Pulmonary effort is normal.     Breath sounds: Normal breath sounds.  Abdominal:     General: Abdomen is flat.     Palpations: Abdomen is soft.     Tenderness: There is no abdominal tenderness. There is no guarding.  Musculoskeletal:        General: No swelling or tenderness. Normal range of motion.     Cervical back: Neck supple.  Skin:    General: Skin is warm and dry.  Neurological:     General: No focal deficit present.     Mental Status: He is alert.  Psychiatric:        Mood and Affect: Mood normal.     ED Results / Procedures / Treatments   EKG EKG Interpretation  Date/Time:  Wednesday June 23 2021 00:53:53 EDT Ventricular Rate:  93 PR Interval:  154 QRS Duration: 93 QT Interval:  342 QTC Calculation: 426 R Axis:   68 Text Interpretation: Duplicate Confirmed by [id number] [phone]) on 06/23/2021 1:13:20 AM  Procedures Procedures  Medications Ordered in the ED Medications  cefTRIAXone (ROCEPHIN) 1 g in sodium chloride 0.9 % 100 mL IVPB (has no administration in time range)  fentaNYL (SUBLIMAZE) injection 50 mcg (50 mcg Intravenous  Given 06/23/21 0109)  iohexol (OMNIPAQUE) 350 MG/ML injection 100 mL (100 mLs Intravenous Contrast Given 06/23/21 0148)  HYDROmorphone (DILAUDID) injection 1 mg (1 mg Intravenous Given 06/23/21 0217)    Initial Impression and Plan  Patient here with upper back and epigastric pain. Differential includes dissection, PUD, biliary colic, renal colic, GERD, esophagitis/spasm. Less likely MSK. Will check labs, send for CTA. Pain meds for comfort.   ED Course   Clinical Course as of 06/23/21 0308  Wed Jun 23, 2021  0132 CBC with mild leukocytosis. Otherwise normal.  [CS]  0153 CMP and Lipase are normal.  [CS]  0303 I personally viewed the images from radiology studies and agree with radiologist interpretation: CT shows acute cholecystitis. Will discuss  with General Surgery.   [CS]  3810 Spoke with Dr. Derrell Lolling, Gen Surg who will accept for admission. Patient and wife aware of plan. Abx ordered. Pain meds as needed.  [CS]    Clinical Course User Index [CS] Pollyann Savoy, MD     MDM Rules/Calculators/A&P Medical Decision Making Problems Addressed: Acute calculous cholecystitis: acute illness or injury  Amount and/or Complexity of Data Reviewed Labs: ordered. Decision-making details documented in ED Course. Radiology: ordered and independent interpretation performed. Decision-making details documented in ED Course. ECG/medicine tests: ordered and independent interpretation performed. Decision-making details documented in ED Course.  Risk Prescription drug management. Parenteral controlled substances. Decision regarding hospitalization.    Final Clinical Impression(s) / ED Diagnoses Final diagnoses:  Acute calculous cholecystitis    Rx / DC Orders ED Discharge Orders     None        Pollyann Savoy, MD 06/23/21 [id number]

## 2021-06-23 NOTE — Anesthesia Postprocedure Evaluation (Signed)
Anesthesia Post Note  Patient: Aaron Mahoney  Procedure(s) Performed: LAPAROSCOPIC CHOLECYSTECTOMY WITH INTRAOPERATIVE CHOLANGIOGRAM     Patient location during evaluation: PACU Anesthesia Type: General Level of consciousness: awake and alert Pain management: pain level controlled Vital Signs Assessment: post-procedure vital signs reviewed and stable Respiratory status: spontaneous breathing, nonlabored ventilation and respiratory function stable Cardiovascular status: blood pressure returned to baseline and stable Postop Assessment: no apparent nausea or vomiting Anesthetic complications: no   No notable events documented.  Last Vitals:  Vitals:   06/23/21 1430 06/23/21 1440  BP: 124/79 (!) 140/91  Pulse: 80 78  Resp: 17 16  Temp: 36.6 C 36.6 C  SpO2: 99% 100%    Last Pain:  Vitals:   06/23/21 1440  TempSrc:   PainSc: 0-No pain                 Lucretia Kern

## 2021-06-23 NOTE — Transfer of Care (Signed)
Immediate Anesthesia Transfer of Care Note  Patient: Aaron Mahoney  Procedure(s) Performed: LAPAROSCOPIC CHOLECYSTECTOMY WITH INTRAOPERATIVE CHOLANGIOGRAM  Patient Location: PACU  Anesthesia Type:General  Level of Consciousness: awake, alert  and oriented  Airway & Oxygen Therapy: Patient Spontanous Breathing and Patient connected to face mask oxygen  Post-op Assessment: Report given to RN and Post -op Vital signs reviewed and stable  Post vital signs: Reviewed and stable  Last Vitals:  Vitals Value Taken Time  BP 163/93 06/23/21 1345  Temp    Pulse 87 06/23/21 1346  Resp 16 06/23/21 1346  SpO2 100 % 06/23/21 1346  Vitals shown include unvalidated device data.  Last Pain:  Vitals:   06/23/21 1100  TempSrc: Oral  PainSc:       Patients Stated Pain Goal: 2 (06/23/21 0940)  Complications: No notable events documented.

## 2021-06-23 NOTE — Discharge Summary (Signed)
[facility] Discharge Summary   Patient ID: Aaron Mahoney MRN: [id number] DOB/AGE: 53/01/1970 53 y.o.  Admit date: 06/23/2021 Discharge date: 06/23/2021  Admitting Diagnosis: Acute cholecystitis   Discharge Diagnosis Acute cholecystitis s/p laparoscopic cholecystectomy   Consultants None   Imaging: DG Cholangiogram Operative  Result Date: 06/23/2021 CLINICAL DATA:  Cholecystectomy. EXAM: INTRAOPERATIVE CHOLANGIOGRAM TECHNIQUE: Cholangiographic images from the C-arm fluoroscopic device were submitted for interpretation post-operatively. COMPARISON:  CTA 06/23/2021 FINDINGS: Fluoroscopic images demonstrate cholecystectomy clips. Opacification of the intrahepatic and extrahepatic biliary system. Contrast drains into the duodenum. No filling defects or stones. No biliary dilatation. FLUOROSCOPY TIME:  Radiation Exposure Index (as provided by the fluoroscopic device): 5.69 mGy Kerma IMPRESSION: Normal intraoperative cholangiogram. Electronically Signed   By: Richarda OverlieAdam  Henn M.D.   On: 06/23/2021 13:38   CT Angio Chest/Abd/Pel for Dissection W and/or W/WO  Result Date: 06/23/2021 CLINICAL DATA:  Chest and back pain. Evaluate for acute aortic dissection. EXAM: CT ANGIOGRAPHY CHEST, ABDOMEN AND PELVIS TECHNIQUE: Initial chest CT without contrast was performed. Multidetector CT imaging through the chest, abdomen and pelvis was performed using the standard protocol during bolus administration of intravenous contrast. Multiplanar reconstructed images and MIPs were obtained and reviewed to evaluate the vascular anatomy. RADIATION DOSE REDUCTION: This exam was performed according to the departmental dose-optimization program which includes automated exposure control, adjustment of the mA and/or kV according to patient size and/or use of iterative reconstruction technique. CONTRAST:  100mL OMNIPAQUE IOHEXOL 350 MG/ML SOLN COMPARISON:  CT abdomen and pelvis no contrast 08/15/2020 FINDINGS: CTA  CHEST FINDINGS Cardiovascular: The cardiac size is normal. There is no pericardial effusion. There is no visible coronary artery calcification. The aorta and great vessels opacify homogeneously without aneurysm, visible plaques or dissection. The pulmonary arteries are normal caliber and centrally clear and the pulmonary veins are decompressed. Mediastinum/Nodes: No enlarged mediastinal, hilar, or axillary lymph nodes. Thyroid gland, trachea, and esophagus demonstrate no significant findings. Lungs/Pleura: Mild asymmetric elevation right hemidiaphragm is again noted. There is no pleural effusion, thickening or pneumothorax. The central airways and lungs clear. Musculoskeletal: There are mild degenerative changes of the thoracic spine. No acute or significant osseous findings. Review of the MIP images confirms the above findings. CTA ABDOMEN AND PELVIS FINDINGS VASCULAR Aorta: Normal caliber aorta without aneurysm, dissection, vasculitis or significant stenosis. Celiac: Patent without evidence of aneurysm, dissection, vasculitis or significant stenosis. SMA: Patent without evidence of aneurysm, dissection, vasculitis or significant stenosis. Renals: Both renal arteries are patent without evidence of aneurysm, dissection, vasculitis, fibromuscular dysplasia or significant stenosis. IMA: Patent without evidence of aneurysm, dissection, vasculitis or significant stenosis. Inflow: Patent without evidence of aneurysm, dissection, vasculitis or significant stenosis. Veins: No obvious venous abnormality within the limitations of this arterial phase study. Review of the MIP images confirms the above findings. NON-VASCULAR Hepatobiliary: The liver is 19 cm length with mild-to-moderate steatosis. No mass enhancement is seen. Gallbladder has slightly dilated since the prior study at 9 cm in length with wall thickening and pericholecystic edema, minimal pericholecystic fluid. There are several gallstones largest is 1 cm and at  least 1 contains air consistent with a cholesterol stone. The findings are consistent with acute cholecystitis. Pancreas: No focal abnormality. Spleen: No focal abnormality or splenomegaly. Adrenals/Urinary Tract: There is no adrenal mass. The renal cortex enhances symmetrically. There is no hydronephrosis either side. Again noted is a 6 mm proximal left ureteral stone which was previously at the [facility] at the level of L2-3 common now is in the  proximal ureter at the level of L3-4. Both ureters are otherwise clear. No intrarenal stones are noted on either side. The bladder is unremarkable for the degree of distention. Stomach/Bowel: No dilatation or wall thickening including the appendix. There is mild fecal stasis ascending and transverse colon. There is diffuse diverticulosis without evidence of diverticulitis. Lymphatic: There is no appreciable adenopathy. Reproductive: Prostate is unremarkable. Other: There are small umbilical and inguinal fat hernias. There is no incarcerated hernia. There is no free air, free hemorrhage or free fluid. Musculoskeletal: There is mild dextroscoliosis and degenerative changes of the spine. Review of the MIP images confirms the above findings. IMPRESSION: 1. Negative CTA study. 2. Cholelithiasis and probable acute cholecystitis. No biliary dilatation. 3. 6 mm proximal left ureteral stone at the level of L3-4, having moved inferiorly only 1 vertebral body level since 08/15/2020. Currently there is no hydronephrosis and both kidneys enhance symmetrically. 4. Hepatic steatosis. 5. Diffuse diverticulosis without evidence of diverticulitis. 6. Small umbilical and inguinal fat hernias. 7. Degenerative changes of the spine, with slight lumbar dextroscoliosis. Electronically Signed   By: Almira Bar M.D.   On: 06/23/2021 02:30    Procedures Dr. Manus Rudd (06/23/21) - Laparoscopic Cholecystectomy with [facility]   Hospital Course:  Patient is a 52 year old male who presented to the ED  with abdominal pain.  Workup showed acute cholecystitis.  Patient was admitted and underwent procedure listed above.  Tolerated procedure well and was transferred to the floor.  Diet was advanced as tolerated.  On POD0, the patient was voiding well, tolerating diet, ambulating well, pain well controlled, vital signs stable, incisions c/d/i and felt stable for discharge home.  Patient will follow up in our office in 3-4 weeks and knows to call with questions or concerns.  He will call to confirm appointment date/time.    Physical Exam: General:  Alert, NAD, pleasant, comfortable Abd:  Soft, ND, mild tenderness, incisions C/D/I  I or a member of my team have reviewed this patient in the Controlled Substance Database.   Allergies as of 06/23/2021   No Known Allergies      Medication List     TAKE these medications    acetaminophen 325 MG tablet Commonly known as: TYLENOL Take 2 tablets (650 mg total) by mouth every 6 (six) hours as needed for mild pain or fever.   b complex vitamins capsule Take 1 capsule by mouth daily.   ibuprofen 600 MG tablet Commonly known as: ADVIL Take 1 tablet (600 mg total) by mouth every 6 (six) hours as needed. Take with food What changed: additional instructions   lisinopril 20 MG tablet Commonly known as: ZESTRIL Take 1 tablet (20 mg total) by mouth daily.   multivitamin with minerals Tabs tablet Take 1 tablet by mouth daily.   oxyCODONE 5 MG immediate release tablet Commonly known as: Oxy IR/ROXICODONE Take 1 tablet (5 mg total) by mouth every 6 (six) hours as needed for moderate pain or severe pain.   tamsulosin 0.4 MG Caps capsule Commonly known as: Flomax Take 1 capsule (0.4 mg total) by mouth daily.          Follow-up Information     [facility]. Go on 07/22/2021.   Specialty: General Surgery Why: 2:00 PM with Saunders Glance, PA-C for follow up. Please arrive 30 min prior to appointment time. Have ID and insurance card  with you. Contact information: [address] [place] [address] [phone]  Signed: Juliet Rude , [facility] 06/24/2021, 10:35 AM Please see Amion for pager number during day hours 7:00am-4:30pm

## 2021-06-23 NOTE — Plan of Care (Signed)
  Problem: Education: Goal: Knowledge of General Education information will improve Description: Including pain rating scale, medication(s)/side effects and non-pharmacologic comfort measures Outcome: Progressing   Problem: Activity: Goal: Risk for activity intolerance will decrease Outcome: Progressing   Problem: Pain Managment: Goal: General experience of comfort will improve Outcome: Progressing   

## 2021-06-23 NOTE — ED Triage Notes (Addendum)
Pt c/o back pain under shoulder blades radiating around to under breasts with 2 episodes of vomiting

## 2021-06-24 ENCOUNTER — Encounter (HOSPITAL_COMMUNITY): Payer: Self-pay | Admitting: Surgery

## 2021-06-24 LAB — SURGICAL PATHOLOGY

## 2021-07-07 ENCOUNTER — Other Ambulatory Visit: Payer: Self-pay | Admitting: Family Medicine

## 2021-10-01 ENCOUNTER — Other Ambulatory Visit: Payer: Self-pay | Admitting: Family Medicine

## 2021-11-22 DIAGNOSIS — D3132 Benign neoplasm of left choroid: Secondary | ICD-10-CM | POA: Diagnosis not present

## 2023-08-15 IMAGING — CT CT ANGIO CHEST-ABD-PELV FOR DISSECTION W/ AND WO/W CM
2 of 9 series · 14 of 46 positions shown, 16 images · IV contrast (Omnipaque)
Comparison: CT abdomen and pelvis no contrast 08/15/2020

CLINICAL DATA: Chest and back pain. Evaluate for acute aortic
dissection.

EXAM:
CT ANGIOGRAPHY CHEST, ABDOMEN AND PELVIS
TECHNIQUE: Initial chest CT without contrast was performed.

[Series 5: axial arterial · axial · arterial · 0.82mm/px · z∈[+679,+1261]mm · 11 of 218 slices shown, 13 images]
[im 12/218  soft-tissue]
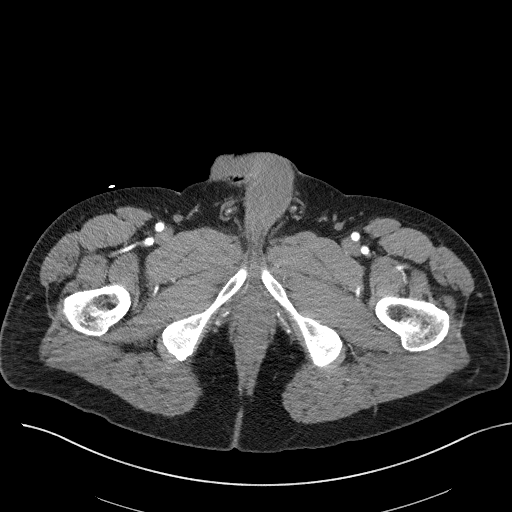
[im 12/218  bone]
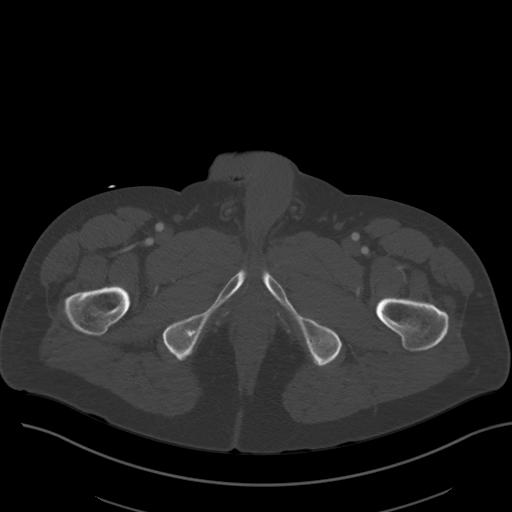
[im 35/218  soft-tissue]
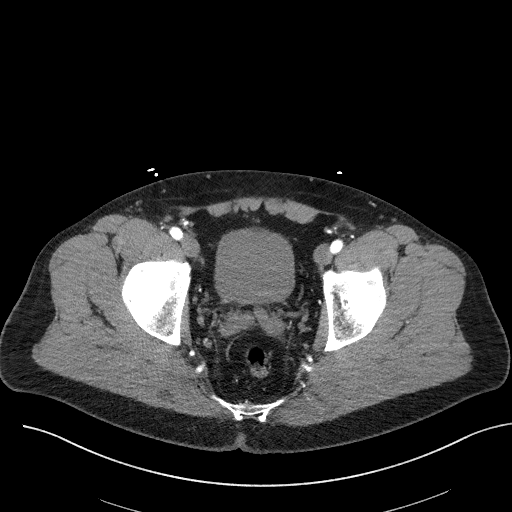
[im 58/218  soft-tissue]
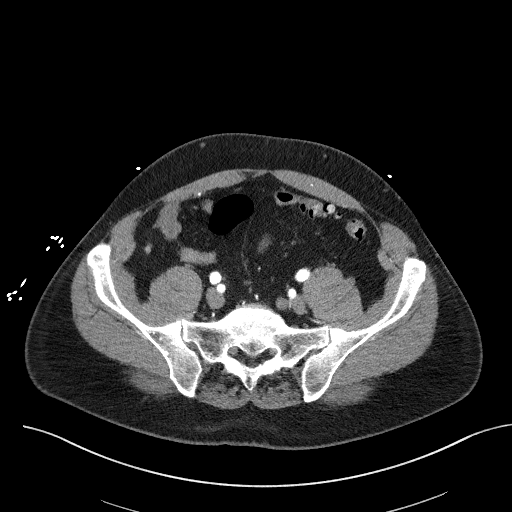
[im 69/218  soft-tissue]
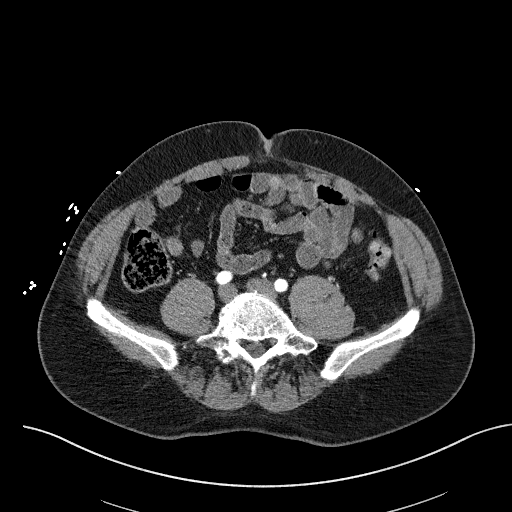
[im 92/218  soft-tissue]
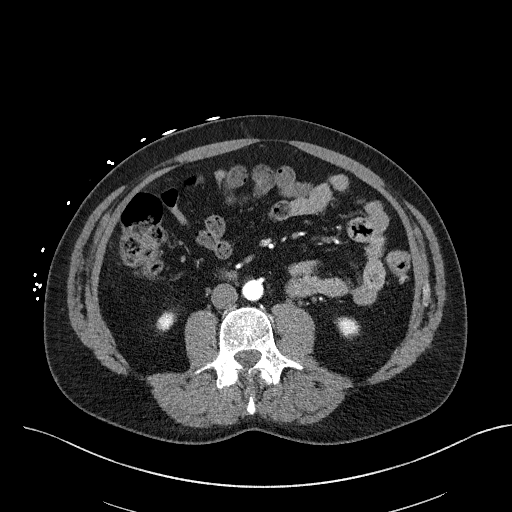
[im 115/218  soft-tissue]
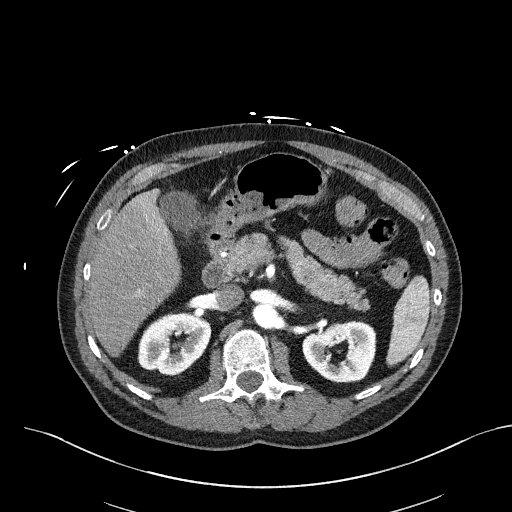
[im 126/218  soft-tissue]
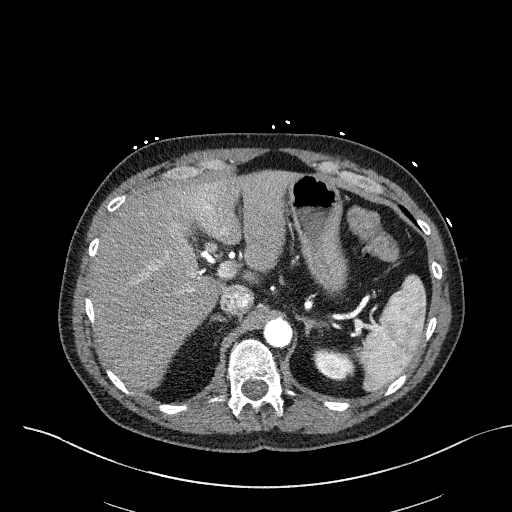
[im 149/218  soft-tissue]
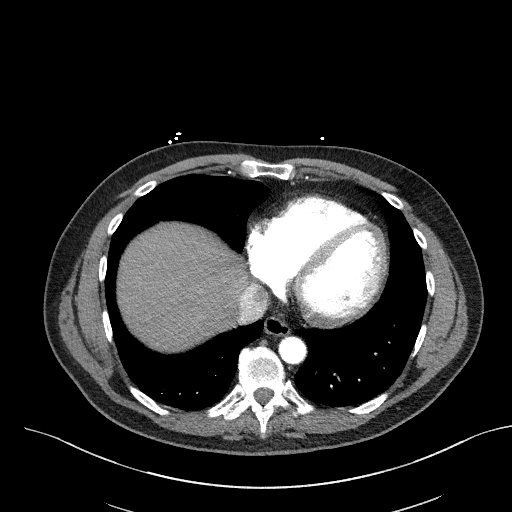
[im 160/218  soft-tissue]
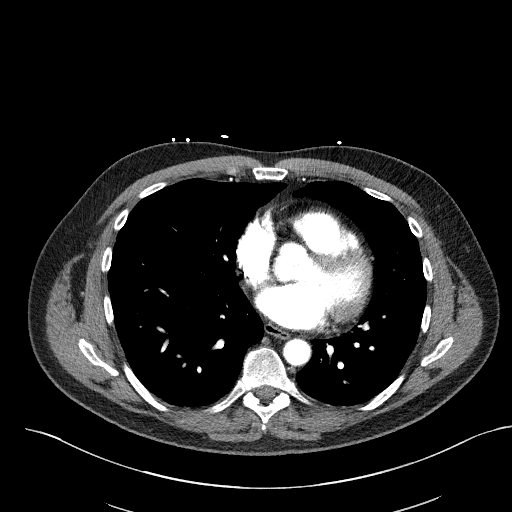
[im 160/218  bone]
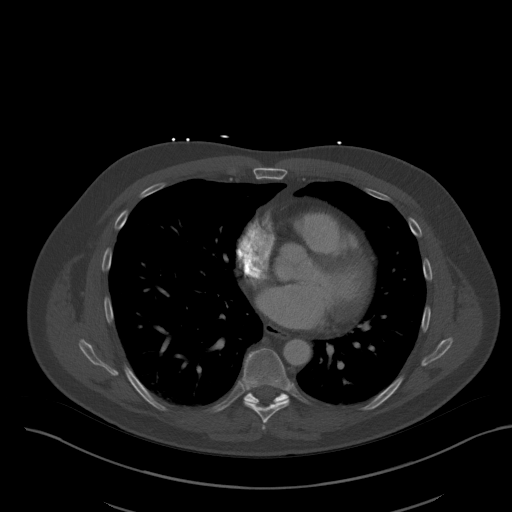
[im 183/218  soft-tissue]
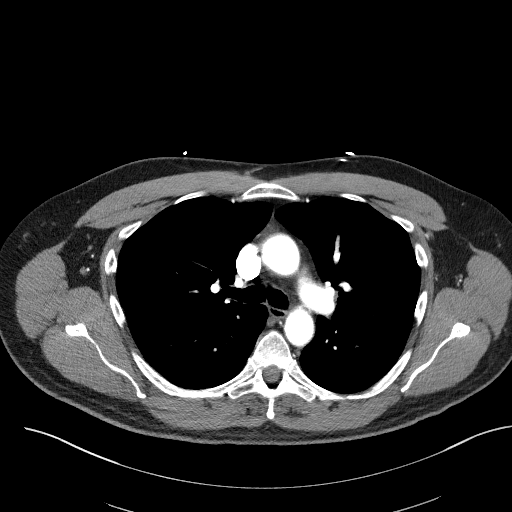
[im 206/218  soft-tissue]
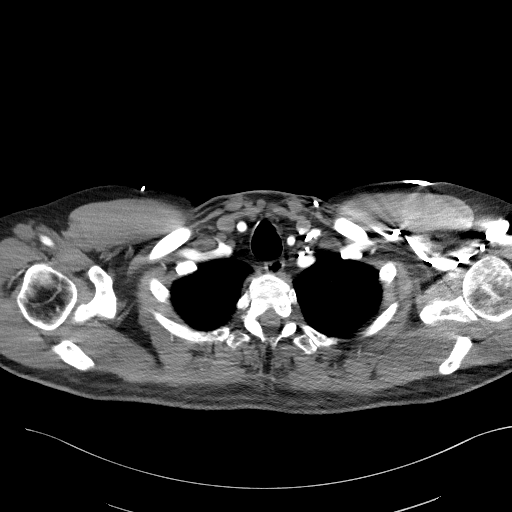

[Series 8: coronals · coronal · 1.04mm/px · 3 of 146 slices shown]
[im 37/146  soft-tissue]
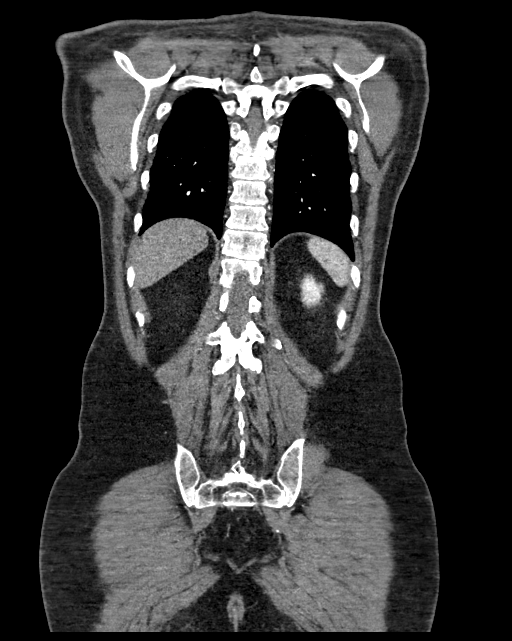
[im 73/146  soft-tissue]
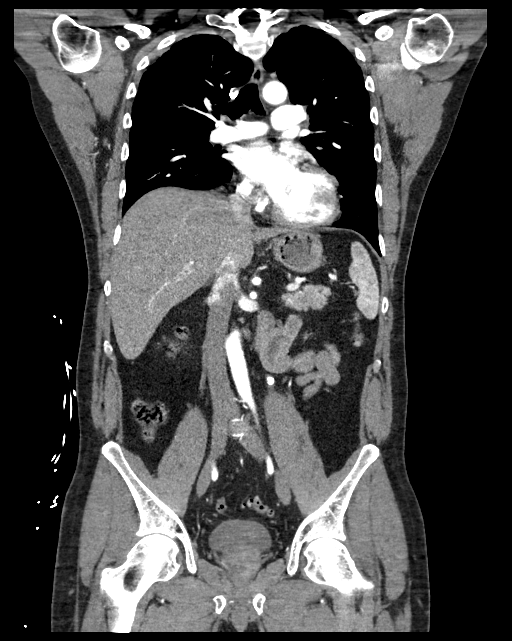
[im 109/146  soft-tissue]
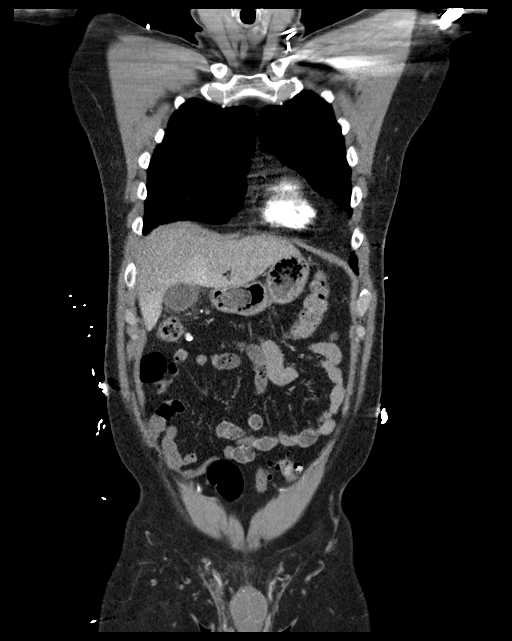

[14 of 46 positions shown; findings below may reference images not displayed]

Multidetector CT imaging through the chest, abdomen and pelvis was
performed using the standard protocol during bolus administration of
intravenous contrast. Multiplanar reconstructed images and MIPs were
obtained and reviewed to evaluate the vascular anatomy.

RADIATION DOSE REDUCTION: This exam was performed according to the
departmental dose-optimization program which includes automated
exposure control, adjustment of the mA and/or kV according to
patient size and/or use of iterative reconstruction technique.

CONTRAST:  100mL OMNIPAQUE IOHEXOL 350 MG/ML SOLN
FINDINGS: CTA CHEST FINDINGS

Cardiovascular: The cardiac size is normal. There is no pericardial
effusion. There is no visible coronary artery calcification. The
aorta and great vessels opacify homogeneously without aneurysm,
visible plaques or dissection. The pulmonary arteries are normal
caliber and centrally clear and the pulmonary veins are
decompressed.

Mediastinum/Nodes: No enlarged mediastinal, hilar, or axillary lymph
nodes. Thyroid gland, trachea, and esophagus demonstrate no
significant findings.

Lungs/Pleura: Mild asymmetric elevation right hemidiaphragm is again
noted. There is no pleural effusion, thickening or pneumothorax. The
central airways and lungs clear.

Musculoskeletal: There are mild degenerative changes of the thoracic
spine. No acute or significant osseous findings.

Review of the MIP images confirms the above findings.

CTA ABDOMEN AND PELVIS FINDINGS

VASCULAR

Aorta: Normal caliber aorta without aneurysm, dissection, vasculitis
or significant stenosis.

Celiac: Patent without evidence of aneurysm, dissection, vasculitis
or significant stenosis.

SMA: Patent without evidence of aneurysm, dissection, vasculitis or
significant stenosis.

Renals: Both renal arteries are patent without evidence of aneurysm,
dissection, vasculitis, fibromuscular dysplasia or significant
stenosis.

IMA: Patent without evidence of aneurysm, dissection, vasculitis or
significant stenosis.

Inflow: Patent without evidence of aneurysm, dissection, vasculitis
or significant stenosis.

Veins: No obvious venous abnormality within the limitations of this
arterial phase study.

Review of the MIP images confirms the above findings.

NON-VASCULAR

Hepatobiliary: The liver is 19 cm length with mild-to-moderate
steatosis. No mass enhancement is seen. Gallbladder has slightly
dilated since the prior study at 9 cm in length with wall thickening
and pericholecystic edema, minimal pericholecystic fluid.

There are several gallstones largest is 1 cm and at least 1 contains
air consistent with a cholesterol stone. The findings are consistent
with acute cholecystitis.

Pancreas: No focal abnormality.

Spleen: No focal abnormality or splenomegaly.

Adrenals/Urinary Tract: There is no adrenal mass. The renal cortex
enhances symmetrically. There is no hydronephrosis either side.

Again noted is a 6 mm proximal left ureteral stone which was
previously at the [facility] at the level of L2-3 common now is in the
proximal ureter at the level of L3-4.

Both ureters are otherwise clear. No intrarenal stones are noted on
either side. The bladder is unremarkable for the degree of
distention.

Stomach/Bowel: No dilatation or wall thickening including the
appendix. There is mild fecal stasis ascending and transverse colon.
There is diffuse diverticulosis without evidence of diverticulitis.

Lymphatic: There is no appreciable adenopathy.

Reproductive: Prostate is unremarkable.

Other: There are small umbilical and inguinal fat hernias. There is
no incarcerated hernia. There is no free air, free hemorrhage or
free fluid.

Musculoskeletal: There is mild dextroscoliosis and degenerative
changes of the spine.

Review of the MIP images confirms the above findings.
IMPRESSION: 1. Negative CTA study.
2. Cholelithiasis and probable acute cholecystitis. No biliary
dilatation.
3. 6 mm proximal left ureteral stone at the level of L3-4, having
moved inferiorly only 1 vertebral body level since 08/15/2020.
Currently there is no hydronephrosis and both kidneys enhance
symmetrically.
4. Hepatic steatosis.
5. Diffuse diverticulosis without evidence of diverticulitis.
6. Small umbilical and inguinal fat hernias.
7. Degenerative changes of the spine, with slight lumbar
dextroscoliosis.

## 2023-12-18 ENCOUNTER — Telehealth: Payer: Self-pay

## 2023-12-18 NOTE — Telephone Encounter (Signed)
 Copied from CRM #[id number]. Topic: Appointments - Scheduling Inquiry for Clinic >> Dec 18, 2023  2:05 PM Zy'onna H wrote: Reason for CRM: The patient called in to schedule his annual physical appointment for next year.   Upon trying multiple times to schedule patient with PCP I was unable to do so.  **Clinic please give the patient a call to set up his appt, as he is not a new patient**

## 2024-01-30 ENCOUNTER — Ambulatory Visit: Admitting: Family Medicine
